# Patient Record
Sex: Female | Born: 1980 | Race: Black or African American | Hispanic: No | Marital: Married | Smoking: Never smoker
Health system: Southern US, Community
[De-identification: ages and names within clinical notes are randomized; demographics above are authoritative.]

## PROBLEM LIST (undated history)

## (undated) ENCOUNTER — Inpatient Hospital Stay (HOSPITAL_COMMUNITY): Payer: Self-pay

## (undated) DIAGNOSIS — I10 Essential (primary) hypertension: Secondary | ICD-10-CM

## (undated) DIAGNOSIS — A6000 Herpesviral infection of urogenital system, unspecified: Secondary | ICD-10-CM

## (undated) DIAGNOSIS — A630 Anogenital (venereal) warts: Secondary | ICD-10-CM

## (undated) HISTORY — DX: Anogenital (venereal) warts: A63.0

## (undated) HISTORY — DX: Herpesviral infection of urogenital system, unspecified: A60.00

---

## 2001-12-28 ENCOUNTER — Other Ambulatory Visit: Admission: RE | Admit: 2001-12-28 | Discharge: 2001-12-28 | Payer: Self-pay | Admitting: Obstetrics and Gynecology

## 2002-03-12 ENCOUNTER — Other Ambulatory Visit: Admission: RE | Admit: 2002-03-12 | Discharge: 2002-03-12 | Payer: Self-pay | Admitting: Obstetrics and Gynecology

## 2002-06-25 ENCOUNTER — Other Ambulatory Visit: Admission: RE | Admit: 2002-06-25 | Discharge: 2002-06-25 | Payer: Self-pay | Admitting: Obstetrics and Gynecology

## 2003-06-19 ENCOUNTER — Other Ambulatory Visit: Admission: RE | Admit: 2003-06-19 | Discharge: 2003-06-19 | Payer: Self-pay | Admitting: Obstetrics and Gynecology

## 2011-11-02 ENCOUNTER — Ambulatory Visit (INDEPENDENT_AMBULATORY_CARE_PROVIDER_SITE_OTHER): Payer: BC Managed Care – PPO | Admitting: Family Medicine

## 2011-11-02 ENCOUNTER — Encounter: Payer: Self-pay | Admitting: Family Medicine

## 2011-11-02 VITALS — BP 110/70 | HR 125 | Temp 98.6°F | Ht 61.0 in | Wt 146.6 lb

## 2011-11-02 DIAGNOSIS — IMO0001 Reserved for inherently not codable concepts without codable children: Secondary | ICD-10-CM

## 2011-11-02 DIAGNOSIS — Z309 Encounter for contraceptive management, unspecified: Secondary | ICD-10-CM

## 2011-11-02 DIAGNOSIS — Z0000 Encounter for general adult medical examination without abnormal findings: Secondary | ICD-10-CM

## 2011-11-02 DIAGNOSIS — Z23 Encounter for immunization: Secondary | ICD-10-CM

## 2011-11-02 LAB — BASIC METABOLIC PANEL
BUN: 12 mg/dL (ref 6–23)
CO2: 26 mEq/L (ref 19–32)
Chloride: 104 mEq/L (ref 96–112)
Potassium: 3.5 mEq/L (ref 3.5–5.1)

## 2011-11-02 LAB — POCT URINALYSIS DIPSTICK
Bilirubin, UA: NEGATIVE
Blood, UA: NEGATIVE
Glucose, UA: NEGATIVE
Ketones, UA: NEGATIVE

## 2011-11-02 LAB — HEPATIC FUNCTION PANEL
ALT: 35 U/L (ref 0–35)
Total Protein: 8.2 g/dL (ref 6.0–8.3)

## 2011-11-02 LAB — LIPID PANEL
Cholesterol: 146 mg/dL (ref 0–200)
Triglycerides: 166 mg/dL — ABNORMAL HIGH (ref 0.0–149.0)

## 2011-11-02 LAB — CBC WITH DIFFERENTIAL/PLATELET
Basophils Relative: 0.4 % (ref 0.0–3.0)
Eosinophils Relative: 1 % (ref 0.0–5.0)
HCT: 38.4 % (ref 36.0–46.0)
Lymphs Abs: 3.6 10*3/uL (ref 0.7–4.0)
MCHC: 33.1 g/dL (ref 30.0–36.0)
MCV: 91.1 fl (ref 78.0–100.0)
Monocytes Absolute: 0.8 10*3/uL (ref 0.1–1.0)
Platelets: 284 10*3/uL (ref 150.0–400.0)
WBC: 10.9 10*3/uL — ABNORMAL HIGH (ref 4.5–10.5)

## 2011-11-02 MED ORDER — LEVONORGESTREL 20 MCG/24HR IU IUD: 1.0000 | INTRAUTERINE_SYSTEM | Freq: Once | INTRAUTERINE | Status: DC

## 2011-11-02 NOTE — Patient Instructions (Signed)

## 2011-11-02 NOTE — Progress Notes (Signed)
  Subjective:    Patient ID: Brittany Ware, female    DOB: 09/08/1981, 31 y.o.   MRN: [id number]  HPI Pt here to establish.  She is utd with her cpe but has not had labs.  Pt would also like a tetanus shot.  No complaints.   Review of Systems    as above Objective:   Physical Exam  Constitutional: She is oriented to person, place, and time. She appears well-developed and well-nourished.  Neck: Normal range of motion. Neck supple.  Cardiovascular: Normal rate, regular rhythm and normal heart sounds.   No murmur heard. Pulmonary/Chest: Effort normal and breath sounds normal. No respiratory distress. She has no wheezes. She has no rales. She exhibits no tenderness.  Musculoskeletal: Normal range of motion. She exhibits no edema and no tenderness.  Neurological: She is alert and oriented to person, place, and time.  Psychiatric: She has a normal mood and affect. Her behavior is normal. Judgment and thought content normal.          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

## 2011-11-03 LAB — HIV ANTIBODY (ROUTINE TESTING W REFLEX): HIV: NONREACTIVE

## 2011-11-03 LAB — RPR

## 2012-05-08 ENCOUNTER — Telehealth: Payer: Self-pay | Admitting: Family Medicine

## 2012-05-08 NOTE — Telephone Encounter (Signed)
appt made for 7.30.13 930am

## 2012-05-08 NOTE — Telephone Encounter (Signed)
Please advise      KP 

## 2012-05-08 NOTE — Telephone Encounter (Signed)
Pt. Called needs a TB test for work if possible this week., pt was NPE  1.22.13 and this was the last & only OV CB# [phone] Let me know once ordered by physician and I will call her back to schedule

## 2012-05-08 NOTE — Telephone Encounter (Signed)
Ok to get ppd

## 2012-05-09 ENCOUNTER — Ambulatory Visit (INDEPENDENT_AMBULATORY_CARE_PROVIDER_SITE_OTHER): Payer: BC Managed Care – PPO

## 2012-05-09 DIAGNOSIS — Z111 Encounter for screening for respiratory tuberculosis: Secondary | ICD-10-CM

## 2012-05-09 MED ORDER — ACYCLOVIR 400 MG PO TABS: 400.0000 mg | ORAL_TABLET | Freq: Three times a day (TID) | ORAL | Status: DC

## 2012-05-11 ENCOUNTER — Ambulatory Visit (INDEPENDENT_AMBULATORY_CARE_PROVIDER_SITE_OTHER): Payer: BC Managed Care – PPO | Admitting: Family Medicine

## 2012-05-11 ENCOUNTER — Encounter: Payer: Self-pay | Admitting: Family Medicine

## 2012-05-11 VITALS — BP 116/70 | HR 118 | Temp 98.3°F | Ht 61.0 in | Wt 153.4 lb

## 2012-05-11 DIAGNOSIS — Z0100 Encounter for examination of eyes and vision without abnormal findings: Secondary | ICD-10-CM

## 2012-05-11 DIAGNOSIS — Z0000 Encounter for general adult medical examination without abnormal findings: Secondary | ICD-10-CM

## 2012-05-11 LAB — TB SKIN TEST: TB Skin Test: NEGATIVE

## 2012-05-11 NOTE — Progress Notes (Signed)
  Subjective:    Patient ID: Brittany Ware, female    DOB: Jan 06, 1981, 31 y.o.   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  No complaints.  She has a gyn she goes to annually.     Review of Systems    as above Objective:   Physical Exam  Constitutional: She is oriented to person, place, and time. She appears well-developed and well-nourished.  Neck: Normal range of motion. Neck supple.  Cardiovascular: Normal rate, regular rhythm and normal heart sounds.   No murmur heard. Pulmonary/Chest: Effort normal and breath sounds normal. No respiratory distress. She has no wheezes. She has no rales.  Musculoskeletal: Normal range of motion.  Neurological: She is oriented to person, place, and time.  Psychiatric: She has a normal mood and affect. Her behavior is normal. Judgment and thought content normal.          Assessment & Plan:  Well visit---- pt sees gyn, form filled out                      PPD done and read

## 2012-08-09 LAB — US OB TRANSVAGINAL

## 2012-08-17 LAB — OB RESULTS CONSOLE HEPATITIS B SURFACE ANTIGEN: Hepatitis B Surface Ag: NEGATIVE

## 2012-08-17 LAB — OB RESULTS CONSOLE ANTIBODY SCREEN: Antibody Screen: NEGATIVE

## 2012-08-17 LAB — OB RESULTS CONSOLE RUBELLA ANTIBODY, IGM: Rubella: IMMUNE

## 2012-08-17 LAB — OB RESULTS CONSOLE ABO/RH

## 2012-08-25 LAB — US OB COMP ADDL GEST LESS 14 WKS

## 2012-08-25 LAB — OB RESULTS CONSOLE GC/CHLAMYDIA
Chlamydia: NEGATIVE
Gonorrhea: NEGATIVE

## 2012-10-06 LAB — US OB DETAIL ADDL GEST + 14 WK

## 2012-12-28 ENCOUNTER — Encounter (HOSPITAL_COMMUNITY): Payer: Self-pay | Admitting: *Deleted

## 2012-12-28 ENCOUNTER — Inpatient Hospital Stay (HOSPITAL_COMMUNITY)
Admission: AD | Admit: 2012-12-28 | Discharge: 2012-12-30 | DRG: 886 | Disposition: A | Discharge status: Home / Self Care | Payer: BC Managed Care – PPO | Source: Ambulatory Visit | Attending: Obstetrics | Admitting: Obstetrics

## 2012-12-28 DIAGNOSIS — O139 Gestational [pregnancy-induced] hypertension without significant proteinuria, unspecified trimester: Secondary | ICD-10-CM | POA: Diagnosis present

## 2012-12-28 DIAGNOSIS — A6000 Herpesviral infection of urogenital system, unspecified: Secondary | ICD-10-CM | POA: Diagnosis present

## 2012-12-28 DIAGNOSIS — O99891 Other specified diseases and conditions complicating pregnancy: Secondary | ICD-10-CM | POA: Diagnosis present

## 2012-12-28 DIAGNOSIS — E0590 Thyrotoxicosis, unspecified without thyrotoxic crisis or storm: Secondary | ICD-10-CM | POA: Diagnosis present

## 2012-12-28 DIAGNOSIS — E079 Disorder of thyroid, unspecified: Secondary | ICD-10-CM | POA: Diagnosis present

## 2012-12-28 DIAGNOSIS — O365990 Maternal care for other known or suspected poor fetal growth, unspecified trimester, not applicable or unspecified: Principal | ICD-10-CM | POA: Diagnosis present

## 2012-12-28 DIAGNOSIS — O98519 Other viral diseases complicating pregnancy, unspecified trimester: Secondary | ICD-10-CM | POA: Diagnosis present

## 2012-12-28 DIAGNOSIS — Z22330 Carrier of Group B streptococcus: Secondary | ICD-10-CM

## 2012-12-28 DIAGNOSIS — IMO0002 Reserved for concepts with insufficient information to code with codable children: Secondary | ICD-10-CM | POA: Diagnosis present

## 2012-12-28 HISTORY — DX: Essential (primary) hypertension: I10

## 2012-12-28 LAB — US OB FOLLOW UP

## 2012-12-28 LAB — CBC
HCT: 34 % — ABNORMAL LOW (ref 36.0–46.0)
Hemoglobin: 11.7 g/dL — ABNORMAL LOW (ref 12.0–15.0)
MCHC: 34.4 g/dL (ref 30.0–36.0)
MCV: 87.4 fL (ref 78.0–100.0)
RDW: 13.4 % (ref 11.5–15.5)

## 2012-12-28 LAB — COMPREHENSIVE METABOLIC PANEL
ALT: 28 U/L (ref 0–35)
Albumin: 3.2 g/dL — ABNORMAL LOW (ref 3.5–5.2)
Alkaline Phosphatase: 125 U/L — ABNORMAL HIGH (ref 39–117)
Calcium: 9.9 mg/dL (ref 8.4–10.5)
Potassium: 3.9 mEq/L (ref 3.5–5.1)
Sodium: 132 mEq/L — ABNORMAL LOW (ref 135–145)
Total Protein: 7.1 g/dL (ref 6.0–8.3)

## 2012-12-28 LAB — URIC ACID: Uric Acid, Serum: 4.5 mg/dL (ref 2.4–7.0)

## 2012-12-28 MED ORDER — DOCUSATE SODIUM 100 MG PO CAPS
100.0000 mg | ORAL_CAPSULE | Freq: Every day | ORAL | Status: DC
  Administered 2012-12-29 – 2012-12-30 (×2): 100 mg via ORAL
  Filled 2012-12-28 (×2): qty 1

## 2012-12-28 MED ORDER — ZOLPIDEM TARTRATE 5 MG PO TABS
5.0000 mg | ORAL_TABLET | Freq: Every evening | ORAL | Status: DC | PRN
  Administered 2012-12-29: 5 mg via ORAL
  Filled 2012-12-28: qty 1

## 2012-12-28 MED ORDER — BETAMETHASONE SOD PHOS & ACET 6 (3-3) MG/ML IJ SUSP
12.0000 mg | INTRAMUSCULAR | Status: AC
  Administered 2012-12-28 – 2012-12-29 (×2): 12 mg via INTRAMUSCULAR
  Filled 2012-12-28 (×2): qty 2

## 2012-12-28 MED ORDER — CALCIUM CARBONATE ANTACID 500 MG PO CHEW: 2.0000 | CHEWABLE_TABLET | ORAL | Status: DC | PRN

## 2012-12-28 MED ORDER — LACTATED RINGERS IV SOLN
INTRAVENOUS | Status: DC
  Administered 2012-12-28 – 2012-12-30 (×5): via INTRAVENOUS

## 2012-12-28 MED ORDER — LABETALOL HCL 100 MG PO TABS
100.0000 mg | ORAL_TABLET | Freq: Three times a day (TID) | ORAL | Status: DC
  Administered 2012-12-29 – 2012-12-30 (×5): 100 mg via ORAL
  Filled 2012-12-28 (×9): qty 1

## 2012-12-28 MED ORDER — PRENATAL MULTIVITAMIN CH
1.0000 | ORAL_TABLET | Freq: Every day | ORAL | Status: DC
  Administered 2012-12-29 – 2012-12-30 (×2): 1 via ORAL
  Filled 2012-12-28 (×3): qty 1

## 2012-12-28 MED ORDER — ACETAMINOPHEN 325 MG PO TABS: 650.0000 mg | ORAL_TABLET | ORAL | Status: DC | PRN

## 2012-12-28 MED ORDER — ACYCLOVIR 200 MG PO CAPS
400.0000 mg | ORAL_CAPSULE | Freq: Three times a day (TID) | ORAL | Status: DC
  Administered 2012-12-28 – 2012-12-30 (×5): 400 mg via ORAL
  Filled 2012-12-28 (×8): qty 2

## 2012-12-28 MED ORDER — LACTATED RINGERS IV BOLUS (SEPSIS)
1000.0000 mL | Freq: Once | INTRAVENOUS | Status: AC
  Administered 2012-12-28: 1000 mL via INTRAVENOUS

## 2012-12-28 NOTE — H&P (Signed)
Maicey Barrientez is a 32 y.o. G1P0 at [redacted]w[redacted]d presenting for fetal monitoring for early IUGR with elevated SD ratios. Pt notes nl fetal movement  Pt notes no contractions. Good fetal movement, No vaginal bleeding, not leaking fluid.  U/s done in office today showed baby 2'7, 6%, w/ AC at 0%, AFI of 8 at 7%, vtx, ant placenta. BPP 8/8 but SD ratios done and elevated >97%.  Pt w/ gestational vs chronic htn, developed at 20 wks. Baseline PIH labs done and normal and baseline 24 hrs urine w/ 100mg  protein. Pt subsequently started on labetalol 100mg  tid. Office bp's usually in 140-150's/ 90-100's but home bp's (pt has taken up to bid) have been 130's/80s. PIH labs last done in office 12/13/12 and normal. Pt also w/ subclinical hyperthyroidism early 1st trimester, nl TSH 10/18/12. Also marginal cord insertion which was indication for u/s today.   [facility] since 10 wks - gest htn vs chronic htn as above - marginal CI vs velamentous CI at anatomy scan - GBS bacteruria - HSV which has caused recurrent lesions this pregnancy, on daily prophylaxis - subclinical hyperthyroidism in 1st trimester w/ normal free thyroid levels and nl TSH in 2nd trimester - anemia, on iron - DS 123   Prenatal Transfer Tool  Maternal Diabetes: No Genetic Screening: Normal Maternal Ultrasounds/Referrals: Abnormal:  Findings:   IUGR Fetal Ultrasounds or other Referrals:  None Maternal Substance Abuse:  No Significant Maternal Medications:  None Significant Maternal Lab Results: None     OB History   Grav Para Term Preterm Abortions TAB SAB Ect Mult Living   1              Past Medical History  Diagnosis Date  . Hypertension    History reviewed. No pertinent past surgical history. Family History: family history is not on file. Social History:  reports that she has never smoked. She does not have any smokeless tobacco history on file. She reports that she does not drink alcohol or use illicit  drugs.  Review of Systems - no abnormal symptoms other than pregnancy     Blood pressure 134/77, pulse 84, temperature 98.1 F (36.7 C), temperature source Oral, resp. rate 18, height 5' (1.524 m), weight 72.122 kg (159 lb).  Physical Exam: Gen: well appearing, no distress CV: RRR Pulm: CTAB Back: no CVAT Abd: gravid, NT, no RUQ pain LE: no edema, equal bilaterally, non-tender, 2+ DTR, no clonus Toco: none FH: baseline 150s, accelerations present, no deceleratons, 10 beat variability  Prenatal labs: ABO, Rh:  O+ Antibody:  neg Rubella:  immune RPR:   NR HBsAg:   neg HIV:   neg GBS:   positive  1 hr Glucola 123   Genetic screening nl NT, nl AFP Anatomy US nl   Assessment/Plan: 32 y.o. G1P0 at [redacted]w[redacted]d - Early IUGR w/ elevated SD ratios. Will admit for continuous fetal monitoring, repeat BPP/ UA dopplers and MFM consult in am. Given risks of indicated preterm delivery, will given BMZ now for fetal lung maturation. Consider Mag for neuroprotection if delivery anticipated w/in 24-48 hrs and less than 32 wks. Unclear etiology of IUGR. Will recheck thyroid, re-eval for velamentous cord insertion, r/o for PEC given her bp trend.  - HSV, daily acylovir prophylaxis - gest htn vs chronic htn. Will cont q8hr labetalol  - Dispo. In house until competion of BMZ and r/o for PEC w/ labs/ urine. Will re-eval need for continued admission for fetal testing at that time.  Tishawna Larouche A. 12/28/2012, 8:55 PM

## 2012-12-29 ENCOUNTER — Ambulatory Visit (HOSPITAL_COMMUNITY): Payer: BC Managed Care – PPO

## 2012-12-29 ENCOUNTER — Inpatient Hospital Stay (HOSPITAL_COMMUNITY): Payer: BC Managed Care – PPO

## 2012-12-29 LAB — PROTEIN, URINE, 24 HOUR: Protein, Urine: 4 mg/dL

## 2012-12-29 LAB — CREATININE CLEARANCE, URINE, 24 HOUR
Collection Interval-CRCL: 24 hours
Creatinine Clearance: 130 mL/min — ABNORMAL HIGH (ref 75–115)
Creatinine, 24H Ur: 1383 mg/d (ref 700–1800)
Creatinine, Urine: 65.86 mg/dL
Urine Total Volume-CRCL: 2100 mL

## 2012-12-29 NOTE — Progress Notes (Signed)
Late entry  Pt notes no HA, no vision change, no RUQ pain. Poor sleep last night, only 2 hrs despite Ambien. Good FM, no ctx, no VB, no abdominal pain.   PE:  bp's 120-130's/ 60-80's Filed Vitals:   12/29/12 1600 12/29/12 1601 12/29/12 1956 12/29/12 2100  BP:  126/61 124/67   Pulse:  92 86   Temp: 98 F (36.7 C)  97.7 F (36.5 C)   TempSrc: Tympanic  Oral   Resp: 18  18 18   Height:      Weight:       Gen: well appearing, no distress CV: RRR Pulm: CTAB Abd: no RUQ pain, gravid, NT, ND GU: def LE: no edema, NT  Toco: no ctx FH: 130s, rare 15x15 accels, good 10x10 accels, no decels, 10 beat var  CMP: wnl 24hr protein: 85mg  TSH: wnl  U/s today: BPP 8/8, AFI 7.8, growth 11%, AC <3%, SD ratios at 74%  A/P:31 yo G1 at 31'0 admitted for IUGR and elevated SD ratios in office 12/28/12  - IUGR. Unclear etiology. S/p BMZ x 2. Repeat u/s today with no IUGR, persistent low AC but other parameters (SD ratio and AFI) have all improved. Likely some component of BMZ, but also bed rest and hydration. Chronic vs gest htn but bp's have been stable. No evidence PEC as 24 hr urine has returned tonight.  - Dispo. Likely home in am for close outpt monitoring  Brittany Ware A. 12/29/2012 10:14 PM

## 2012-12-29 NOTE — Consult Note (Signed)
Maternal Fetal Medicine Consultation  Requesting Provider(s): Noland Fordyce, MD  Reason for consultation: suspected IUGR, gestational hypertension  HPI: Brittany Ware is a 32 yo G1P0 currently at 39 0/7 weeks seen for consultation due to suspected fetal growth restriction and gestational hypertension.  Brittany Ware as admitted yesterday after a clinic ultrasound showed an estimated fetal weight at the 10th %tile with elevated Umbilical Artery Dopplers. Additionally, the patient was recently diagnosed with chronic vs. Gestational hypertension.  She is currently on Labetalol 100 mg TID.  Her baseline 24-hr urine protein was 100 mg.  Admission preeclampsia labs were within normal limits and repeat 24-hr urine protein is pending.  The patient received her first betamethasone injection last night.    Brittany Ware denies a history of hypertension prior to pregnancy.  Her initial clinic BPs were 120's /80's. She reports a history of HSV and is currently on Acyclovir- her prenatal course has otherwise been uncomplicated.  The fetus is active.  She is without complaints today.  OB History: OB History   Grav Para Term Preterm Abortions TAB SAB Ect Mult Living   1               PMH:  Past Medical History  Diagnosis Date  . Hypertension     PSH: History reviewed. No pertinent past surgical history. Meds:  Scheduled Meds: . acyclovir  400 mg Oral TID  . betamethasone acetate-betamethasone sodium phosphate  12 mg Intramuscular Q24H  . docusate sodium  100 mg Oral Daily  . labetalol  100 mg Oral Q8H  . prenatal multivitamin  1 tablet Oral Q1200   Continuous Infusions: . lactated ringers 150 mL/hr at 12/29/12 0234   PRN Meds:.acetaminophen, calcium carbonate, zolpidem  Allergies: NKDA  FH: denies birth defects or hereditary disorders  Soc: denies tobacco, ETOH or illicit drug use  Review of Systems: no vaginal bleeding or cramping/contractions, no LOF, no nausea/vomiting. All other systems  reviewed and are negative.   PE:   Filed Vitals:   12/29/12 1226  BP: 138/74  Pulse: 91  Temp: 98 F (36.7 C)  Resp: 20    GEN: well-appearing female ABD: gravid, NT  Ultrasound:  Single IUP at 31 0/7 weeks Somewhat limited views of the fetal heart and face obtained due to fetal position- no anomalies noted Suspected fetal growth restriction with an estimated fetal weight at the 11th %tile.  The [facility] measures < 3rd %tile Umbilical artery Doppler studies at the 74th %tile for gestaional age.  No absent or reversed diastolic flow noted. Subjectively decreased amniotic fluid volume with an AFI of 7.8 cm. BPP 8/8  Labs: CBC    Component Value Date/Time   WBC 13.7* 12/28/2012 1840   RBC 3.89 12/28/2012 1840   HGB 11.7* 12/28/2012 1840   HCT 34.0* 12/28/2012 1840   PLT 181 12/28/2012 1840   MCV 87.4 12/28/2012 1840   MCH 30.1 12/28/2012 1840   MCHC 34.4 12/28/2012 1840   RDW 13.4 12/28/2012 1840   Uric acid - 4.5   Nl LFTs.  24-hr urine prending.  A/P: 1) Single IUP at 31 0/7 weeks         2) Gestational vs. Chronic hypertension - BPs appear to be well-controlled on current medications.  24-hr urine protein is currently pending.         3) Suspected fetal growth restriction - UA Dopplers mildly elevated but without absent or reversed diastolic flow on today's study (althoug > 97th %tile on clinic scan  yesterday).  Concur with admission and betamethasone.  If the 24-hr urine returns normal, would feel comfortable with continued close outpatient management - would recommend 2x weekly testing (NST in clinic early in the week and BPP with Doppler studies with Maternal-Fetal Medicine later in the week).  Would recommend delivery for - BPP < 6/8, absent or reversed diastolic flow on UA Dopplers.  If the 24-hr urine is abnormal (> 300 mg) would recommend continued inpatient management with daily NSTs and 2x weekly UA Doppler studies.  Would move toward delivery if preeclampsia with severe  features noted (LFTs > 2x normal, plts <100) or non reassuring fetal testing appreciated.      Thank you for the opportunity to be a part of the care of Brittany Ware. Please contact our office if we can be of further assistance.   I spent approximately 30 minutes with this patient with over 50% of time spent in face-to-face counseling.

## 2012-12-30 DIAGNOSIS — IMO0002 Reserved for concepts with insufficient information to code with codable children: Secondary | ICD-10-CM | POA: Diagnosis present

## 2012-12-30 MED ORDER — ACYCLOVIR 200 MG PO CAPS: 400.0000 mg | ORAL_CAPSULE | Freq: Three times a day (TID) | ORAL | Status: DC

## 2012-12-30 NOTE — Discharge Summary (Signed)
Patient ID: Beckey Polkowski MRN: [id number] DOB/AGE: 1981/04/02 32 y.o.  Admit date: 12/28/2012 Discharge date: 12/30/12  Admission Diagnoses: 31 WEEKS POSSIBLE PRE ECLAMPSIA, IUGR  Discharge Diagnoses: 31 low fetal growth, no IUGR, no PEC, chronic vs gestational htn        Discharged Condition: good  Hospital Course: admitted for IVF, modified bed rest and fetal monitoring. Received BMZ. Normal PEC labs including 24 hr urine, improvement in fetal u/s by growth, SD ratio and AFI.  Consults: MFM  Treatments: IV hydration  Disposition: home     Medication List    TAKE these medications       acyclovir 200 MG capsule  Commonly known as:  ZOVIRAX  Take 2 capsules (400 mg total) by mouth 3 (three) times daily.     acyclovir 400 MG tablet  Commonly known as:  ZOVIRAX  Take 400 mg by mouth 3 (three) times daily. X 10 days. Started on 12/26/12.     DHA PO  Take 1 tablet by mouth daily.     labetalol 100 MG tablet  Commonly known as:  NORMODYNE  Take 100 mg by mouth 3 (three) times daily.     prenatal multivitamin Tabs  Take 1 tablet by mouth daily at 12 noon.         Signed: Lendon Colonel., MD MD 12/30/2012, 12:41 PM

## 2012-12-30 NOTE — Progress Notes (Signed)
HD#3  Pt notes no HA, no vision change, no RUQ pain.  Good FM, no ctx, no VB, no abdominal pain. Pt would like to go home  PE:  bp's mostly 120-130's/ 60-80's, slightly elevated sbp in 140's this am  Filed Vitals:   12/30/12 0700 12/30/12 0844 12/30/12 0845 12/30/12 1212  BP:   143/74 144/76  Pulse:   77 78  Temp:  97.9 F (36.6 C)  98.1 F (36.7 C)  TempSrc:  Oral  Oral  Resp: 18 20  16   Height:      Weight:       Gen: well appearing, no distress CV: RRR Pulm: CTAB Abd: no RUQ pain, gravid, NT, ND GU: def LE: no edema, NT  Toco: no ctx FH: 145s, rare 15x15 accels, good 10x10 accels, no decels, 10 beat var  CMP: wnl 24hr protein: 85mg  TSH: wnl  U/s 3/21: BPP 8/8, AFI 7.8, growth 11%, AC <3%, SD ratios at 74%  A/P:31 yo G1 at 31'1 admitted for IUGR and elevated SD ratios in office 12/28/12  - IUGR. Unclear etiology. S/p BMZ x 2. Repeat u/s yesterday with no IUGR, persistent low AC but other parameters (SD ratio and AFI) have all improved. Likely some component of BMZ, but also bed rest and hydration. Chronic vs gest htn but bp's have been stable. No evidence PEC by  24 hr urine.  - gest vs chronic htn. Cont labetalol  - Dispo. Home this,  close outpt monitoring. Kick counts reviewed, will plan Mon/ thurs testing in office  - appreciate MFM recommendations  Brittany Ware A. 12/30/2012 12:36 PM

## 2012-12-30 NOTE — Progress Notes (Signed)
Dr Ernestina Penna called and updated on approx 2 minute decel from 150's to 120's.  RN may still discharge pt

## 2013-01-01 LAB — US OB FOLLOW UP

## 2013-01-03 ENCOUNTER — Encounter (HOSPITAL_COMMUNITY): Payer: Self-pay | Admitting: *Deleted

## 2013-01-05 LAB — US OB FOLLOW UP

## 2013-01-08 LAB — US OB FOLLOW UP

## 2013-01-11 LAB — US OB FOLLOW UP

## 2013-01-15 ENCOUNTER — Other Ambulatory Visit: Payer: Self-pay

## 2013-01-15 LAB — US OB FOLLOW UP

## 2013-01-16 ENCOUNTER — Other Ambulatory Visit (HOSPITAL_COMMUNITY): Payer: Self-pay | Admitting: Obstetrics

## 2013-01-16 DIAGNOSIS — O4103X1 Oligohydramnios, third trimester, fetus 1: Secondary | ICD-10-CM

## 2013-01-16 DIAGNOSIS — IMO0002 Reserved for concepts with insufficient information to code with codable children: Secondary | ICD-10-CM

## 2013-01-19 ENCOUNTER — Encounter (HOSPITAL_COMMUNITY): Payer: Self-pay

## 2013-01-19 ENCOUNTER — Encounter (HOSPITAL_COMMUNITY): Payer: Self-pay | Admitting: Anesthesiology

## 2013-01-19 ENCOUNTER — Ambulatory Visit (HOSPITAL_COMMUNITY)
Admission: RE | Admit: 2013-01-19 | Discharge: 2013-01-19 | Disposition: A | Discharge status: Home / Self Care | Payer: BC Managed Care – PPO | Source: Ambulatory Visit | Attending: Obstetrics | Admitting: Obstetrics

## 2013-01-19 ENCOUNTER — Inpatient Hospital Stay (HOSPITAL_COMMUNITY): Payer: BC Managed Care – PPO | Admitting: Anesthesiology

## 2013-01-19 ENCOUNTER — Other Ambulatory Visit (HOSPITAL_COMMUNITY): Payer: Self-pay | Admitting: Obstetrics

## 2013-01-19 ENCOUNTER — Ambulatory Visit (HOSPITAL_COMMUNITY): Admission: RE | Admit: 2013-01-19 | Payer: BC Managed Care – PPO | Source: Ambulatory Visit

## 2013-01-19 ENCOUNTER — Encounter (HOSPITAL_COMMUNITY)
Admission: AD | Disposition: A | Discharge status: Home / Self Care | Payer: Self-pay | Source: Ambulatory Visit | Attending: Obstetrics

## 2013-01-19 ENCOUNTER — Inpatient Hospital Stay (HOSPITAL_COMMUNITY)
Admission: AD | Admit: 2013-01-19 | Discharge: 2013-01-21 | DRG: 370 | Disposition: A | Discharge status: Home / Self Care | Payer: BC Managed Care – PPO | Source: Ambulatory Visit | Attending: Obstetrics | Admitting: Obstetrics

## 2013-01-19 VITALS — BP 136/92 | HR 95 | Wt 162.0 lb

## 2013-01-19 DIAGNOSIS — O10019 Pre-existing essential hypertension complicating pregnancy, unspecified trimester: Secondary | ICD-10-CM | POA: Insufficient documentation

## 2013-01-19 DIAGNOSIS — E0590 Thyrotoxicosis, unspecified without thyrotoxic crisis or storm: Secondary | ICD-10-CM | POA: Diagnosis present

## 2013-01-19 DIAGNOSIS — Z22330 Carrier of Group B streptococcus: Secondary | ICD-10-CM

## 2013-01-19 DIAGNOSIS — O98519 Other viral diseases complicating pregnancy, unspecified trimester: Secondary | ICD-10-CM | POA: Diagnosis present

## 2013-01-19 DIAGNOSIS — O4103X1 Oligohydramnios, third trimester, fetus 1: Secondary | ICD-10-CM

## 2013-01-19 DIAGNOSIS — IMO0002 Reserved for concepts with insufficient information to code with codable children: Secondary | ICD-10-CM

## 2013-01-19 DIAGNOSIS — E079 Disorder of thyroid, unspecified: Secondary | ICD-10-CM | POA: Diagnosis present

## 2013-01-19 DIAGNOSIS — A6000 Herpesviral infection of urogenital system, unspecified: Secondary | ICD-10-CM | POA: Diagnosis present

## 2013-01-19 DIAGNOSIS — O1002 Pre-existing essential hypertension complicating childbirth: Secondary | ICD-10-CM | POA: Diagnosis present

## 2013-01-19 DIAGNOSIS — O9903 Anemia complicating the puerperium: Secondary | ICD-10-CM | POA: Diagnosis not present

## 2013-01-19 DIAGNOSIS — O4100X0 Oligohydramnios, unspecified trimester, not applicable or unspecified: Secondary | ICD-10-CM | POA: Insufficient documentation

## 2013-01-19 DIAGNOSIS — O365990 Maternal care for other known or suspected poor fetal growth, unspecified trimester, not applicable or unspecified: Secondary | ICD-10-CM | POA: Insufficient documentation

## 2013-01-19 DIAGNOSIS — D649 Anemia, unspecified: Secondary | ICD-10-CM | POA: Diagnosis not present

## 2013-01-19 DIAGNOSIS — O99284 Endocrine, nutritional and metabolic diseases complicating childbirth: Secondary | ICD-10-CM | POA: Diagnosis present

## 2013-01-19 DIAGNOSIS — O99892 Other specified diseases and conditions complicating childbirth: Secondary | ICD-10-CM | POA: Diagnosis present

## 2013-01-19 LAB — COMPREHENSIVE METABOLIC PANEL
ALT: 26 U/L (ref 0–35)
Alkaline Phosphatase: 158 U/L — ABNORMAL HIGH (ref 39–117)
CO2: 22 mEq/L (ref 19–32)
Chloride: 102 mEq/L (ref 96–112)
GFR calc Af Amer: >90 mL/min (ref >90)
GFR calc non Af Amer: >90 mL/min (ref >90)
Glucose, Bld: 81 mg/dL (ref 70–99)
Potassium: 4.2 mEq/L (ref 3.5–5.1)
Sodium: 135 mEq/L (ref 135–145)

## 2013-01-19 LAB — CBC
HCT: 35.7 % — ABNORMAL LOW (ref 36.0–46.0)
Hemoglobin: 12 g/dL (ref 12.0–15.0)
MCH: 29.5 pg (ref 26.0–34.0)
MCHC: 33.6 g/dL (ref 30.0–36.0)
MCV: 87.7 fL (ref 78.0–100.0)

## 2013-01-19 SURGERY — Surgical Case
Anesthesia: Regional | Wound class: Clean Contaminated

## 2013-01-19 MED ORDER — SCOPOLAMINE 1 MG/3DAYS TD PT72
MEDICATED_PATCH | TRANSDERMAL | Status: AC
  Filled 2013-01-19: qty 1

## 2013-01-19 MED ORDER — KETOROLAC TROMETHAMINE 30 MG/ML IJ SOLN: 30.0000 mg | Freq: Four times a day (QID) | INTRAMUSCULAR | Status: DC | PRN

## 2013-01-19 MED ORDER — SENNOSIDES-DOCUSATE SODIUM 8.6-50 MG PO TABS
2.0000 | ORAL_TABLET | Freq: Every day | ORAL | Status: DC
  Administered 2013-01-19 – 2013-01-20 (×2): 2 via ORAL

## 2013-01-19 MED ORDER — PRENATAL MULTIVITAMIN CH
1.0000 | ORAL_TABLET | Freq: Every day | ORAL | Status: DC
  Administered 2013-01-20: 1 via ORAL
  Filled 2013-01-19: qty 1

## 2013-01-19 MED ORDER — DIPHENHYDRAMINE HCL 25 MG PO CAPS: 25.0000 mg | ORAL_CAPSULE | ORAL | Status: DC | PRN

## 2013-01-19 MED ORDER — OXYTOCIN 40 UNITS IN LACTATED RINGERS INFUSION - SIMPLE MED: 62.5000 mL/h | INTRAVENOUS | Status: AC

## 2013-01-19 MED ORDER — DIPHENHYDRAMINE HCL 50 MG/ML IJ SOLN: 25.0000 mg | INTRAMUSCULAR | Status: DC | PRN

## 2013-01-19 MED ORDER — OXYTOCIN 40 UNITS IN LACTATED RINGERS INFUSION - SIMPLE MED
1.0000 m[IU]/min | INTRAVENOUS | Status: DC
  Administered 2013-01-19: 2 m[IU]/min via INTRAVENOUS
  Filled 2013-01-19: qty 1000

## 2013-01-19 MED ORDER — NALBUPHINE HCL 10 MG/ML IJ SOLN
5.0000 mg | INTRAMUSCULAR | Status: DC | PRN
  Filled 2013-01-19: qty 1

## 2013-01-19 MED ORDER — MENTHOL 3 MG MT LOZG: 1.0000 | LOZENGE | OROMUCOSAL | Status: DC | PRN

## 2013-01-19 MED ORDER — ONDANSETRON HCL 4 MG PO TABS: 4.0000 mg | ORAL_TABLET | ORAL | Status: DC | PRN

## 2013-01-19 MED ORDER — OXYTOCIN BOLUS FROM INFUSION: 500.0000 mL | INTRAVENOUS | Status: DC

## 2013-01-19 MED ORDER — KETOROLAC TROMETHAMINE 30 MG/ML IJ SOLN
INTRAMUSCULAR | Status: AC
  Filled 2013-01-19: qty 1

## 2013-01-19 MED ORDER — METOCLOPRAMIDE HCL 5 MG/ML IJ SOLN: 10.0000 mg | Freq: Three times a day (TID) | INTRAMUSCULAR | Status: DC | PRN

## 2013-01-19 MED ORDER — MORPHINE SULFATE (PF) 0.5 MG/ML IJ SOLN
INTRAMUSCULAR | Status: DC | PRN
  Administered 2013-01-19: .15 mg via INTRATHECAL

## 2013-01-19 MED ORDER — NALOXONE HCL 1 MG/ML IJ SOLN
1.0000 ug/kg/h | INTRAVENOUS | Status: DC | PRN
  Filled 2013-01-19: qty 2

## 2013-01-19 MED ORDER — FENTANYL CITRATE 0.05 MG/ML IJ SOLN
INTRAMUSCULAR | Status: DC | PRN
  Administered 2013-01-19: 25 ug via INTRATHECAL

## 2013-01-19 MED ORDER — ONDANSETRON HCL 4 MG/2ML IJ SOLN: 4.0000 mg | INTRAMUSCULAR | Status: DC | PRN

## 2013-01-19 MED ORDER — DEXTROSE 5 % IV SOLN
5.0000 10*6.[IU] | Freq: Once | INTRAVENOUS | Status: AC
  Administered 2013-01-19: 5 10*6.[IU] via INTRAVENOUS
  Filled 2013-01-19: qty 5

## 2013-01-19 MED ORDER — IBUPROFEN 600 MG PO TABS: 600.0000 mg | ORAL_TABLET | Freq: Four times a day (QID) | ORAL | Status: DC | PRN

## 2013-01-19 MED ORDER — LACTATED RINGERS IV SOLN: 500.0000 mL | INTRAVENOUS | Status: DC | PRN

## 2013-01-19 MED ORDER — LIDOCAINE HCL (PF) 1 % IJ SOLN: 30.0000 mL | INTRAMUSCULAR | Status: DC | PRN

## 2013-01-19 MED ORDER — ZOLPIDEM TARTRATE 5 MG PO TABS: 5.0000 mg | ORAL_TABLET | Freq: Every evening | ORAL | Status: DC | PRN

## 2013-01-19 MED ORDER — LACTATED RINGERS IV SOLN
INTRAVENOUS | Status: DC | PRN
  Administered 2013-01-19: 12:00:00 via INTRAVENOUS

## 2013-01-19 MED ORDER — LACTATED RINGERS IV SOLN: INTRAVENOUS | Status: DC

## 2013-01-19 MED ORDER — ONDANSETRON HCL 4 MG/2ML IJ SOLN: 4.0000 mg | Freq: Four times a day (QID) | INTRAMUSCULAR | Status: DC | PRN

## 2013-01-19 MED ORDER — ACETAMINOPHEN 325 MG PO TABS: 650.0000 mg | ORAL_TABLET | ORAL | Status: DC | PRN

## 2013-01-19 MED ORDER — SIMETHICONE 80 MG PO CHEW
80.0000 mg | CHEWABLE_TABLET | Freq: Three times a day (TID) | ORAL | Status: DC
  Administered 2013-01-19 – 2013-01-21 (×6): 80 mg via ORAL

## 2013-01-19 MED ORDER — CITRIC ACID-SODIUM CITRATE 334-500 MG/5ML PO SOLN
30.0000 mL | ORAL | Status: DC | PRN
  Administered 2013-01-19: 30 mL via ORAL
  Filled 2013-01-19: qty 15

## 2013-01-19 MED ORDER — ACYCLOVIR 200 MG PO CAPS
400.0000 mg | ORAL_CAPSULE | Freq: Three times a day (TID) | ORAL | Status: DC
  Administered 2013-01-19 – 2013-01-21 (×5): 400 mg via ORAL
  Filled 2013-01-19 (×8): qty 2

## 2013-01-19 MED ORDER — LACTATED RINGERS IV SOLN
INTRAVENOUS | Status: DC
  Administered 2013-01-19 (×3): via INTRAVENOUS

## 2013-01-19 MED ORDER — DIBUCAINE 1 % RE OINT: 1.0000 "application " | TOPICAL_OINTMENT | RECTAL | Status: DC | PRN

## 2013-01-19 MED ORDER — OXYTOCIN 40 UNITS IN LACTATED RINGERS INFUSION - SIMPLE MED: 62.5000 mL/h | INTRAVENOUS | Status: DC

## 2013-01-19 MED ORDER — FENTANYL CITRATE 0.05 MG/ML IJ SOLN
INTRAMUSCULAR | Status: AC
  Filled 2013-01-19: qty 2

## 2013-01-19 MED ORDER — SCOPOLAMINE 1 MG/3DAYS TD PT72
1.0000 | MEDICATED_PATCH | Freq: Once | TRANSDERMAL | Status: DC
  Administered 2013-01-19: 1.5 mg via TRANSDERMAL

## 2013-01-19 MED ORDER — MORPHINE SULFATE 0.5 MG/ML IJ SOLN
INTRAMUSCULAR | Status: AC
  Filled 2013-01-19: qty 10

## 2013-01-19 MED ORDER — DIPHENHYDRAMINE HCL 25 MG PO CAPS: 25.0000 mg | ORAL_CAPSULE | Freq: Four times a day (QID) | ORAL | Status: DC | PRN

## 2013-01-19 MED ORDER — OXYCODONE-ACETAMINOPHEN 5-325 MG PO TABS
1.0000 | ORAL_TABLET | ORAL | Status: DC | PRN
  Administered 2013-01-20: 1 via ORAL
  Filled 2013-01-19 (×2): qty 1

## 2013-01-19 MED ORDER — LANOLIN HYDROUS EX OINT: 1.0000 "application " | TOPICAL_OINTMENT | CUTANEOUS | Status: DC | PRN

## 2013-01-19 MED ORDER — KETOROLAC TROMETHAMINE 30 MG/ML IJ SOLN
30.0000 mg | Freq: Four times a day (QID) | INTRAMUSCULAR | Status: DC | PRN
  Administered 2013-01-19: 30 mg via INTRAMUSCULAR

## 2013-01-19 MED ORDER — SIMETHICONE 80 MG PO CHEW: 80.0000 mg | CHEWABLE_TABLET | ORAL | Status: DC | PRN

## 2013-01-19 MED ORDER — IBUPROFEN 600 MG PO TABS
600.0000 mg | ORAL_TABLET | Freq: Four times a day (QID) | ORAL | Status: DC
  Administered 2013-01-20 – 2013-01-21 (×6): 600 mg via ORAL
  Filled 2013-01-19 (×6): qty 1

## 2013-01-19 MED ORDER — DIPHENHYDRAMINE HCL 50 MG/ML IJ SOLN
12.5000 mg | INTRAMUSCULAR | Status: DC | PRN
Start: 2013-01-19 — End: 2013-01-21

## 2013-01-19 MED ORDER — TERBUTALINE SULFATE 1 MG/ML IJ SOLN: 0.2500 mg | Freq: Once | INTRAMUSCULAR | Status: DC | PRN

## 2013-01-19 MED ORDER — ONDANSETRON HCL 4 MG/2ML IJ SOLN
INTRAMUSCULAR | Status: AC
  Filled 2013-01-19: qty 2

## 2013-01-19 MED ORDER — FENTANYL CITRATE 0.05 MG/ML IJ SOLN: 25.0000 ug | INTRAMUSCULAR | Status: DC | PRN

## 2013-01-19 MED ORDER — LABETALOL HCL 100 MG PO TABS
100.0000 mg | ORAL_TABLET | Freq: Three times a day (TID) | ORAL | Status: DC
  Administered 2013-01-19 – 2013-01-21 (×4): 100 mg via ORAL
  Filled 2013-01-19 (×8): qty 1

## 2013-01-19 MED ORDER — ONDANSETRON HCL 4 MG/2ML IJ SOLN
INTRAMUSCULAR | Status: DC | PRN
  Administered 2013-01-19: 4 mg via INTRAVENOUS

## 2013-01-19 MED ORDER — MEPERIDINE HCL 25 MG/ML IJ SOLN: 6.2500 mg | INTRAMUSCULAR | Status: DC | PRN

## 2013-01-19 MED ORDER — TETANUS-DIPHTH-ACELL PERTUSSIS 5-2.5-18.5 LF-MCG/0.5 IM SUSP: 0.5000 mL | Freq: Once | INTRAMUSCULAR | Status: DC

## 2013-01-19 MED ORDER — NALOXONE HCL 0.4 MG/ML IJ SOLN: 0.4000 mg | INTRAMUSCULAR | Status: DC | PRN

## 2013-01-19 MED ORDER — OXYTOCIN 10 UNIT/ML IJ SOLN
INTRAMUSCULAR | Status: DC | PRN
  Administered 2013-01-19: 40 [IU] via INTRAMUSCULAR

## 2013-01-19 MED ORDER — ONDANSETRON HCL 4 MG/2ML IJ SOLN: 4.0000 mg | Freq: Three times a day (TID) | INTRAMUSCULAR | Status: DC | PRN

## 2013-01-19 MED ORDER — PENICILLIN G POTASSIUM 5000000 UNITS IJ SOLR
2.5000 10*6.[IU] | INTRAVENOUS | Status: DC
  Filled 2013-01-19 (×4): qty 2.5

## 2013-01-19 MED ORDER — ACYCLOVIR 400 MG PO TABS
400.0000 mg | ORAL_TABLET | Freq: Three times a day (TID) | ORAL | Status: DC
  Filled 2013-01-19 (×3): qty 1

## 2013-01-19 MED ORDER — OXYTOCIN 10 UNIT/ML IJ SOLN
INTRAMUSCULAR | Status: AC
  Filled 2013-01-19: qty 4

## 2013-01-19 MED ORDER — BUPIVACAINE IN DEXTROSE 0.75-8.25 % IT SOLN
INTRATHECAL | Status: DC | PRN
  Administered 2013-01-19: 1.4 mL via INTRATHECAL

## 2013-01-19 MED ORDER — WITCH HAZEL-GLYCERIN EX PADS: 1.0000 "application " | MEDICATED_PAD | CUTANEOUS | Status: DC | PRN

## 2013-01-19 MED ORDER — SODIUM CHLORIDE 0.9 % IJ SOLN: 3.0000 mL | INTRAMUSCULAR | Status: DC | PRN

## 2013-01-19 MED ORDER — OXYCODONE-ACETAMINOPHEN 5-325 MG PO TABS: 1.0000 | ORAL_TABLET | ORAL | Status: DC | PRN

## 2013-01-19 SURGICAL SUPPLY — 34 items
CLOTH BEACON ORANGE TIMEOUT ST (SAFETY) ×2 IMPLANT
CONTAINER PREFILL 10% NBF 15ML (MISCELLANEOUS) IMPLANT
DRAPE LG THREE QUARTER DISP (DRAPES) ×2 IMPLANT
DRSG OPSITE POSTOP 4X10 (GAUZE/BANDAGES/DRESSINGS) ×2 IMPLANT
DURAPREP 26ML APPLICATOR (WOUND CARE) ×2 IMPLANT
ELECT REM PT RETURN 9FT ADLT (ELECTROSURGICAL) ×2
ELECTRODE REM PT RTRN 9FT ADLT (ELECTROSURGICAL) ×1 IMPLANT
EXTRACTOR VACUUM KIWI (MISCELLANEOUS) IMPLANT
EXTRACTOR VACUUM M CUP 4 TUBE (SUCTIONS) IMPLANT
GLOVE BIO SURGEON STRL SZ 6 (GLOVE) ×2 IMPLANT
GLOVE BIO SURGEON STRL SZ 6.5 (GLOVE) ×8 IMPLANT
GLOVE BIOGEL PI IND STRL 7.0 (GLOVE) ×1 IMPLANT
GLOVE BIOGEL PI INDICATOR 7.0 (GLOVE) ×1
GLOVE SURG SS PI 7.5 STRL IVOR (GLOVE) ×2 IMPLANT
GOWN STRL REIN XL XLG (GOWN DISPOSABLE) ×4 IMPLANT
KIT ABG SYR 3ML LUER SLIP (SYRINGE) IMPLANT
NEEDLE HYPO 25X5/8 SAFETYGLIDE (NEEDLE) IMPLANT
NS IRRIG 1000ML POUR BTL (IV SOLUTION) ×2 IMPLANT
PACK C SECTION WH (CUSTOM PROCEDURE TRAY) ×2 IMPLANT
PAD OB MATERNITY 4.3X12.25 (PERSONAL CARE ITEMS) ×2 IMPLANT
SLEEVE SCD COMPRESS KNEE MED (MISCELLANEOUS) IMPLANT
STAPLER VISISTAT 35W (STAPLE) IMPLANT
STRIP CLOSURE SKIN 1/2X4 (GAUZE/BANDAGES/DRESSINGS) IMPLANT
SUT MON AB 4-0 PS1 27 (SUTURE) IMPLANT
SUT PLAIN 0 NONE (SUTURE) IMPLANT
SUT PLAIN 2 0 XLH (SUTURE) IMPLANT
SUT VIC AB 0 CT1 36 (SUTURE) ×2 IMPLANT
SUT VIC AB 0 CTX 36 (SUTURE) ×3
SUT VIC AB 0 CTX36XBRD ANBCTRL (SUTURE) ×3 IMPLANT
SUT VIC AB 2-0 CT1 27 (SUTURE) ×1
SUT VIC AB 2-0 CT1 TAPERPNT 27 (SUTURE) ×1 IMPLANT
TOWEL OR 17X24 6PK STRL BLUE (TOWEL DISPOSABLE) ×6 IMPLANT
TRAY FOLEY CATH 14FR (SET/KITS/TRAYS/PACK) IMPLANT
WATER STERILE IRR 1000ML POUR (IV SOLUTION) ×2 IMPLANT

## 2013-01-19 NOTE — Progress Notes (Signed)
Brittany Ware  was seen today for an ultrasound appointment.  See full report in [facility].  Comments: Brittany Ware returns for follow  up due to suspected fetal growth restriction.  She was admitted in March for a 24-hr urine protein collection and betamethasone.  Her most recent 24-hr urine was within normal limits.  Her blood pressure on arrival to clinic was 156/98; repeat 136/92.   Ultrasound today reveals oligohydramnios.  Interval growth has been marginal (80 g over 3 weeks) with an estimated fetal weight well below the 10th %tile. Umbilical artery Doppler studies are elevated (> 90th %tile) with at least on tracing showing intermittent absent diastolic flow.  The MCA PI  was < 2.5% consistent with cephalization.  BPP was 6/8 (-2 for absent breathing movement).  Impression: Single IUP at 34 0/7 weeks Marginal fetal growth over the last 3 weeks. EFW well below the 10th %tile. Oligohydramnios Elevated UA Dopplers with some intermittently absent diastolic flow BPP 6/8 (-2 for absent fetal breathing movement)  Recommendations: Based on new findings of oligohydramnios, intermittently absent diastolic flow on UA Dopplers and poor interval growth- recommend delivery. Had brief discussion with patient regarding route of delivery - likelihood that the fetus will not tolerate labor. Consider CST - if positive, would move toward cesarean delivery. Consider preeclampsia labs - would have low threshold for magnesium sulfate prophylaxis if patient continues to be hypertensive.  Findings and recommendations discussed with Dr. Algie Coffer.  Alpha Gula, MD

## 2013-01-19 NOTE — Anesthesia Postprocedure Evaluation (Signed)
Anesthesia Post Note  Patient: Brittany Ware  Procedure(s) Performed: Procedure(s) (LRB): CESAREAN SECTION (N/A)  Anesthesia type: Spinal  Patient location: Mother/Baby  Post pain: Pain level controlled  Post assessment: Post-op Vital signs reviewed  Last Vitals:  Filed Vitals:   01/19/13 1700  BP: 142/92  Pulse: 79  Temp: 36.6 C  Resp: 18    Post vital signs: Reviewed  Level of consciousness: awake  Complications: No apparent anesthesia complications

## 2013-01-19 NOTE — Transfer of Care (Signed)
Immediate Anesthesia Transfer of Care Note  Patient: Brittany Ware  Procedure(s) Performed: Procedure(s) with comments: CESAREAN SECTION (N/A) - primary   Patient Location: PACU  Anesthesia Type:Spinal  Level of Consciousness: awake  Airway & Oxygen Therapy: Patient Spontanous Breathing  Post-op Assessment: Report given to PACU RN  Post vital signs: Reviewed and stable  Complications: No apparent anesthesia complications

## 2013-01-19 NOTE — ED Notes (Signed)
Pt taken to room 171 for CST.

## 2013-01-19 NOTE — Anesthesia Preprocedure Evaluation (Addendum)
Anesthesia Evaluation  Patient identified by MRN, date of birth, ID band Patient awake    Reviewed: Allergy & Precautions, H&P , Patient's Chart, lab work & pertinent test results  Airway Mallampati: IV TM Distance: >3 FB Neck ROM: limited   Comment: Large tongue Dental no notable dental hx.    Pulmonary neg pulmonary ROS,  breath sounds clear to auscultation  Pulmonary exam normal       Cardiovascular hypertension, negative cardio ROS  Rhythm:regular Rate:Normal     Neuro/Psych negative neurological ROS  negative psych ROS   GI/Hepatic negative GI ROS, Neg liver ROS,   Endo/Other  negative endocrine ROS  Renal/GU negative Renal ROS     Musculoskeletal   Abdominal   Peds  Hematology negative hematology ROS (+)   Anesthesia Other Findings   Reproductive/Obstetrics (+) Pregnancy                          Anesthesia Physical Anesthesia Plan  ASA: III and emergent  Anesthesia Plan: Spinal   Post-op Pain Management:    Induction:   Airway Management Planned: Natural Airway  Additional Equipment:   Intra-op Plan:   Post-operative Plan:   Informed Consent: I have reviewed the patients History and Physical, chart, labs and discussed the procedure including the risks, benefits and alternatives for the proposed anesthesia with the patient or authorized representative who has indicated his/her understanding and acceptance.   Dental advisory given  Plan Discussed with: CRNA, Anesthesiologist and Surgeon  Anesthesia Plan Comments:        Anesthesia Quick Evaluation

## 2013-01-19 NOTE — Op Note (Addendum)
01/19/2013  1:21 PM  PATIENT:  Brittany Ware  32 y.o. female  PRE-OPERATIVE DIAGNOSIS:  non reasurring fetal heart rate, IUGR, intermittent absent end diastolic flow  POST-OPERATIVE DIAGNOSIS:  non reasurring fetal heart rate, IUGR, intermittent absent end diastolic flow  PROCEDURE:  Procedure(s) with comments: CESAREAN SECTION (N/A) - primary  Low transverse cesarean section with 2 layer closure SURGEON:  Surgeon(s) and Role:    * Darcel Frane A. Ernestina Penna, MD - Primary  PHYSICIAN ASSISTANT:   ASSISTANTSBilly Coast, MD   ANESTHESIA:   spinal  EBL:  Total I/O In: 1900 [I.V.:1900] Out: 800 [Urine:200; Blood:600]  BLOOD ADMINISTERED:none  DRAINS: Urinary Catheter (Foley)   LOCAL MEDICATIONS USED:  NONE  SPECIMEN:  Source of Specimen:  Placenta, cord blood  DISPOSITION OF SPECIMEN:  PATHOLOGY  COUNTS:  YES  TOURNIQUET:  * No tourniquets in log *  DICTATION: .Dragon Dictation  PLAN OF CARE: Admit to inpatient   PATIENT DISPOSITION:  PACU - hemodynamically stable.   Delay start of Pharmacological VTE agent (>24hrs) due to surgical blood loss or risk of bleeding: yes   Findings:  @BABYSEXEBC @ infant,  APGAR (1 MIN): 9   APGAR (5 MINS): 9   APGAR (10 MINS):     EBL: per anasthersia cc Antibiotics:  2 g Ancef  Complications: none  Indications: This is a 32 y.o. year-old, G1 P0  At [redacted]w[redacted]d admitted for delivery due to IUGR with oligohydramnios and intermittent absent end-diastolic flow. Discussed with patient options of primary cesarean section versus attempted contraction stress test. Over approximately one and a half hours on the fetal monitor, baby showed minimal variability and no accelerations. Pitocin was started the patient only received 3 contractions with each contraction baby had a late deceleration. Given that patient was remote from delivery and the nonreassuring fetal testing as well as the known placental insufficiency the decision was made to proceed with cesarean  section. Risks benefits and alternatives of the procedure were discussed with the patient who agreed to proceed  Procedure:  After informed consent was obtained the patient was taken to the operating room where spinal anesthesia was initiated.  She was prepped and draped in the normal sterile fashion in dorsal supine position with a leftward tilt.  A foley catheter was inserted sterilely into the bladder. Gloves were changes and attention was turned to the patient's abdomen. A Pfannenstiel skin incision was made 2 cm above the pubic symphysis in the midline with the scalpel.  Dissection was carried down with the Bovie cautery until the fascia was reached. The fascia was incised in the midline. The incision was extended laterally with the Mayo scissors. The inferior aspect of the fascial incision was grasped with the Coker clamps, elevated up and the underlying rectus muscles were dissected off sharply. The superior aspect of the fascial incision was grasped with the Coker clamps elevated up and the underlying rectus muscles were dissected off sharply.  The peritoneum was entered sharply. The peritoneal incision was extended superiorly and inferiorly with good visualization of the bladder. The bladder blade was inserted, the vesicouterine peritoneum was identified but no bladder flap was made. Palpation was done to assess the fetal position and the location of the uterine vessels. Increased vascularity was noted in the lower uterine segment. Overall the lower uterine segment was underdeveloped the there was still sufficient room for a low transverse cesarean section. The lower segment of the uterus was incised sharply with the scalpel and extended superiorly and laterally with the bandage  scissors. The infant also was grasped brought to the incision,  rotated and the infant was delivered with fundal pressure. The nose and mouth were bulb suctioned. The cord was clamped and cut. The infant was handed off to the  waiting pediatrician. The placenta was expressed. The uterus was exteriorized. The uterus was cleared of all clots and debris. The uterine incision was repaired with 0 Vicryl in a running locked fashion.  A second layer of the same suture was used in an imbricating fashion to obtain excellent hemostasis. 4 additional figure-of-eight sutures were placed along the left angle of the incision for hemostasis.  The uterus was then returned to the abdomen, the gutters were cleared of all clots and debris. The uterine incision was reinspected and found to be hemostatic. The peritoneum was grasped and closed with 2-0 Vicryl in a running fashion. The cut muscle edges and the underside of the fascia were inspected and found to be hemostatic. The fascia was closed with 0 Vicryl in a running suture. The subcutaneous tissue was irrigated. Scarpa's layer was closed with a 2-0 plain gut suture. The skin was closed staples.  The patient tolerated the procedure well. Sponge lap and needle counts were correct x3 and patient was taken to the recovery room in a stable condition.  Gabriel Paulding A. 01/19/2013 1:22 PM   Operative findings: Female in the vertex position, small uterus with normal contour, normal tubes and ovaries, normal placenta, three-vessel cord, clear amniotic fluid, no clear etiology for growth restriction.  Thorne Wirz A. 01/19/2013 1:27 PM

## 2013-01-19 NOTE — Progress Notes (Signed)
Discussed plan of care with pt for primary c-section, discussed risks and benefits, pt verbalized understanding

## 2013-01-19 NOTE — H&P (Signed)
CC: admitted for delivery given IUGR with oligohydramnios and intermittent absent EDF.   HPI: Brittany Ware is a 32 y.o. G1P0 at [redacted]w[redacted]d presenting for delivery. Pt has been followed over the past 3 wks for low fetal growth with elevated SD ratios. Pt was admitted for  fetal monitoring and BMZ at 31 wks when office u/s showed  IUGR with elevated SD ratios. After BMZ, SD ratios improved for about 1 wk and f/u u/s showed baby increased in wt to the 10%. Since that time, SD ratios increased to 97% and pt has now developed oligohydramnios and now intermittent absent SD ratios. BPP this am 6/8.  MFM consult today recommended delivery at this time.  Pt notes overall good fetal movement though decreased some today. No HA, no vision change, no RUQ pain, no N/V. No LOF, no ctx.   Pt w/ gestational vs chronic htn, developed at 20 wks. Baseline PIH labs done and normal and baseline 24 hrs urine w/ 100mg  protein. Pt subsequently started on labetalol 100mg  tid. Office bp's usually in 140-150's/ 90-100's but home bp's (pt has taken up to bid) have been 130's/80s. PIH labs done weekly over the past month and always normal, 24 hr urine for protein also normal. Pt also w/ subclinical hyperthyroidism early 1st trimester, nl TSH 10/18/12. Also marginal cord insertion which was indication for u/s today.   [facility]  - IUGR,  U/s at 31 wks: 2'7, 6%, w/ AC at 0%, AFI of 8 at 7%, vtx, ant placenta. BPP 8/8 but SD ratios  elevated >97%.   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, on daily prophylaxis since mid 2nd trimester and no current lesions - subclinical hyperthyroidism in 1st trimester w/ normal free thyroid levels and nl TSH in 2nd trimester  - anemia, on iron  - DS 123   Prenatal Transfer Tool   Maternal Diabetes: No  Genetic  Screening: Normal  Maternal Ultrasounds/Referrals: Abnormal: Findings: IUGR  Fetal Ultrasounds or other Referrals: None  Maternal Substance Abuse: No  Significant Maternal Medications: None  Significant Maternal Lab Results: None  OB History    Grav  Para  Term  Preterm  Abortions  TAB  SAB  Ect  Mult  Living    1               Past Medical History   Diagnosis  Date   .  Hypertension     History reviewed. No pertinent past surgical history.  Family History: family history is not on file.  Social History: reports that she has never smoked. She does not have any smokeless tobacco history on file. She reports that she does not drink alcohol or use illicit drugs.  Review of Systems - no abnormal symptoms other than pregnancy   .  Physical Exam:    Gen: well appearing, no distress, anxious CV: RRR  Pulm: CTAB  Back: no CVAT  Abd: gravid, NT, no RUQ pain  LE: no edema, equal bilaterally, non-tender, 2+ DTR, no clonus  Toco: none  FH: baseline 150s, accelerations present, no deceleratons, 10 beat variability  GU: cvx FT/ long/ vtx -3/ medium consistency/ posterior  Prenatal labs:  ABO, Rh: O+  Antibody: neg  Rubella: immune  RPR: NR  HBsAg: neg  HIV: neg  GBS: positive  1 hr Glucola 123  Genetic screening nl NT, nl AFP  Anatomy US nl   Assessment/Plan: 33 y.o. G1P0 at [redacted]w[redacted]d - IUGR w/ oligohydramnios and intermittent absent SD ratios. Move towards delivery. I d/w pt option for PCS now, as high liklihood baby will not tolerate labor, vs attempt at CST and move to delivery PCS if baby does not tolerated contractions. If baby passes CST, will give consideration of ripening with cervical foley and cytotec. - HSV, no current lesions, OK to attempt vaginal delivery - gest htn vs chronic htn. Will cont q8hr labetalol, repeat PIH labs now.  - GBS pos. Start PCN now.

## 2013-01-19 NOTE — Anesthesia Postprocedure Evaluation (Signed)
  Anesthesia Post-op Note  Patient: Brittany Ware  Procedure(s) Performed: Procedure(s) with comments: CESAREAN SECTION (N/A) - primary   Patient Location: PACU  Anesthesia Type:Spinal  Level of Consciousness: awake, alert  and oriented  Airway and Oxygen Therapy: Patient Spontanous Breathing  Post-op Pain: 2 /10, mild  Post-op Assessment: Post-op Vital signs reviewed, Patient's Cardiovascular Status Stable, Respiratory Function Stable, Patent Airway, No signs of Nausea or vomiting, Pain level controlled, No headache, No backache and No residual numbness  Post-op Vital Signs: Reviewed and stable  Complications: No apparent anesthesia complications

## 2013-01-19 NOTE — Brief Op Note (Signed)
01/19/2013  1:21 PM  PATIENT:  Brittany Ware  32 y.o. female  PRE-OPERATIVE DIAGNOSIS:  non reasurring fetal heart rate, IUGR, intermittent absent end diastolic flow  POST-OPERATIVE DIAGNOSIS:  non reasurring fetal heart rate, IUGR, intermittent absent end diastolic flow  PROCEDURE:  Procedure(s) with comments: CESAREAN SECTION (N/A) - primary  Low transverse cesarean section with 2 layer closure SURGEON:  Surgeon(s) and Role:    * Reid Nawrot A. Ernestina Penna, MD - Primary  PHYSICIAN ASSISTANT:   ASSISTANTSBilly Coast, MD   ANESTHESIA:   spinal  EBL:  Total I/O In: 1900 [I.V.:1900] Out: 800 [Urine:200; Blood:600]  BLOOD ADMINISTERED:none  DRAINS: Urinary Catheter (Foley)   LOCAL MEDICATIONS USED:  NONE  SPECIMEN:  Source of Specimen:  Placenta, cord blood  DISPOSITION OF SPECIMEN:  PATHOLOGY  COUNTS:  YES  TOURNIQUET:  * No tourniquets in log *  DICTATION: .Dragon Dictation  PLAN OF CARE: Admit to inpatient   PATIENT DISPOSITION:  PACU - hemodynamically stable.   Delay start of Pharmacological VTE agent (>24hrs) due to surgical blood loss or risk of bleeding: yes

## 2013-01-20 LAB — CBC
HCT: 29.7 % — ABNORMAL LOW (ref 36.0–46.0)
Hemoglobin: 9.9 g/dL — ABNORMAL LOW (ref 12.0–15.0)
MCH: 29.5 pg (ref 26.0–34.0)
MCHC: 33.3 g/dL (ref 30.0–36.0)
MCV: 88.4 fL (ref 78.0–100.0)
RBC: 3.36 MIL/uL — ABNORMAL LOW (ref 3.87–5.11)

## 2013-01-20 NOTE — Progress Notes (Signed)
Subjective: Postpartum Day 1 s/p Primary Cesarean Delivery for severe IUGR and abn Dopplers.  Patient reports tolerating PO, + flatus and no problems voiding.  Denies pain. Has been to NICU to see the baby. Plan to breast feed, will work with pumping  Objective: Vital signs in last 24 hours: Temp:  [97.9 F (36.6 C)-99.3 F (37.4 C)] 98.3 F (36.8 C) (04/12 2144) Pulse Rate:  [74-96] 92 (04/12 2144) Resp:  [16-20] 19 (04/12 2144) BP: (112-152)/(68-90) 118/68 mmHg (04/12 2144) SpO2:  [98 %-100 %] 99 % (04/12 2144)  Physical Exam:  General: alert and cooperative Lochia: appropriate Uterine Fundus: firm Incision: healing well DVT Evaluation: No evidence of DVT seen on physical exam.   Recent Labs  01/19/13 1020 01/20/13 0530  HGB 12.0 9.9*  HCT 35.7* 29.7*  H/H dropped Rh(+), Rub Imm, Took TDap in 3rd trim.   Assessment/Plan: Status post Cesarean section. Doing well postoperatively.  Continue current care. Lactation assistance Continue Labetalol 100mg  tid.  Anemia- start Iron once PO tolerated.    Brittany Ware 01/20/2013, 9:53 PM

## 2013-01-21 ENCOUNTER — Encounter (HOSPITAL_COMMUNITY)
Admission: RE | Admit: 2013-01-21 | Discharge: 2013-01-21 | Disposition: A | Discharge status: Home / Self Care | Payer: BC Managed Care – PPO | Source: Ambulatory Visit | Attending: Obstetrics | Admitting: Obstetrics

## 2013-01-21 MED ORDER — IBUPROFEN 600 MG PO TABS: 600.0000 mg | ORAL_TABLET | Freq: Four times a day (QID) | ORAL | Status: DC

## 2013-01-21 MED ORDER — SENNOSIDES-DOCUSATE SODIUM 8.6-50 MG PO TABS: 2.0000 | ORAL_TABLET | Freq: Every day | ORAL | Status: DC

## 2013-01-21 MED ORDER — LABETALOL HCL 100 MG PO TABS: 100.0000 mg | ORAL_TABLET | Freq: Two times a day (BID) | ORAL | Status: DC

## 2013-01-21 MED ORDER — OXYCODONE-ACETAMINOPHEN 5-325 MG PO TABS: 1.0000 | ORAL_TABLET | Freq: Four times a day (QID) | ORAL | Status: DC | PRN

## 2013-01-21 NOTE — Clinical Social Work Note (Signed)
Clinical Social Work Department PSYCHOSOCIAL ASSESSMENT - MATERNAL/CHILD 01/20/2013  Patient:  Brittany Ware, Brittany Ware  Account Number:  [id number]  Admit Date:  01/19/2013  Brittany Ware Name:   Brittany Ware    Clinical Social Worker:  Brittany Hayward, LCSW   Date/Time:  01/21/2013 11:00 AM  Date Referred:  01/20/2013   Referral source  Physician     Referred reason  NICU   Other referral source:    I:  FAMILY / HOME ENVIRONMENT Child's legal guardian:  PARENT  Guardian - Name Guardian - Age Guardian - Address  Brittany Ware [address] [place], [place] [address]  Brittany Ware  [address] [place], [place] [address]   Other household support members/support persons Name Relationship DOB  none (first baby)     Other support:   MOB and FOB report good family support.    II  PSYCHOSOCIAL DATA Information Source:  Patient Interview  Event organiser Employment:   Surveyor, quantity resources:  Media planner If [employer] - [place]:    School / Grade:   Maternity Care Coordinator / Child Services Coordination / Early Interventions:  Cultural issues impacting care:    III  STRENGTHS Strengths  Adequate Resources  Home prepared for Child (including basic supplies)  Supportive family/friends  Compliance with medical plan  Understanding of illness   Strength comment:    IV  RISK FACTORS AND CURRENT PROBLEMS Current Problem:  None   Risk Factor & Current Problem Patient Issue Family Issue Risk Factor / Current Problem Comment   N N     V  SOCIAL WORK ASSESSMENT CSW spoke with MOB and FOB in room.  CSW discussed admission to NICU and understanding of illness.  MOB and FOB report good communication with MD's and nurses and understand current treatment.  CSW discussed any emotional concerns.  MOB and FOB reported appropriate emotion response to infant being in NICU.  CSW discussed PPD with MOB and symptoms to look out for.  No hx of MH concerns for MOB per chart.  No hx  of SA concerns per chart. CSW discussed support from SW while infant is in NICU.  CSW discussed supplies and family support.  MOB and FOB report good family support that live in area.  MOB and FOB report they have not bought everything for infant yet, however will let CSW know if any supply concerns arise for infant. CSW discussed infants qualifications for SSI and completed worksheet to start application.  CSW will continue to follow to offer support while infant is in NICU.      VI SOCIAL WORK PLAN Social Work Plan  Psychosocial Support/Ongoing Assessment of Needs   Type of pt/family education:   PPD symptoms  SSI qualifications   If child protective services report - county:   If child protective services report - date:   Information/referral to community resources comment:   Other social work plan:

## 2013-01-21 NOTE — Progress Notes (Signed)
Discharge instructions reviewed with patient.  Patient states understanding of home care, medications, activity, signs/symptoms to report to MD and return MD office visit.  Patient ambulated in stable condition for discharge.

## 2013-01-21 NOTE — Discharge Summary (Signed)
Obstetric Discharge Summary Reason for Admission: Cesarean delivery for severe IUGR, oligohydramnios, absent end diastolic flow. G1P0 at [redacted]w[redacted]d presenting for delivery. Pt has been followed over the past 3 wks for low fetal growth with elevated SD ratios. Pt was admitted for fetal monitoring and BMZ at 31 wks when office u/s showed IUGR with elevated SD ratios. After BMZ, SD ratios improved for about 1 wk and f/u u/s showed baby increased in wt to the 10%. Since that time, SD ratios increased to 97% and pt has now developed oligohydramnios and now intermittent absent SD ratios. BPP this am 6/8. MFM consult today recommended delivery at this time  Complications-Operative and Postpartum: none Hemoglobin  Date Value Range Status  01/20/2013 9.9* 12.0 - 15.0 g/dL Final     REPEATED TO VERIFY     DELTA CHECK NOTED     HCT  Date Value Range Status  01/20/2013 29.7* 36.0 - 46.0 % Final    Physical Exam:  General: alert and cooperative  Lochia: appropriate Uterine Fundus: firm Incision: healing well DVT Evaluation: No evidence of DVT seen on physical exam.  Discharge Diagnoses: Preterm Low transverse cesarean delivery on 01/19/13, baby in NICU, stable  Discharge Information: Date: 01/21/2013 Activity: pelvic rest Diet: routine Medications: PNV, Tylenol #3 and Ibuprofen, Iron Condition: stable Instructions: refer to practice specific booklet Discharge to: home   Newborn Data: Live born female  Birth Weight: 2 lb 15.6 oz (1350 g) APGAR: 9, 9 NICU, stable.  Brittany Ware R 01/21/2013, 11:19 AM

## 2013-01-21 NOTE — Progress Notes (Signed)
Subjective: Postpartum Day 2: Cesarean Delivery Patient reports tolerating PO, + flatus and no problems voiding.  Gas pain last night.  Lochia appropriate Objective: Vital signs in last 24 hours: Temp:  [97.9 F (36.6 C)-98.4 F (36.9 C)] 98.4 F (36.9 C) (04/13 0557) Pulse Rate:  [75-96] 94 (04/13 0557) Resp:  [18-20] 19 (04/12 2144) BP: (112-121)/(68-74) 121/72 mmHg (04/13 0557) SpO2:  [96 %-99 %] 96 % (04/13 0557)  Physical Exam:  General: alert and cooperative Lungs CTA bilat CV RRR Abdo soft NT, normal BS, non acute Uterine Fundus: firm Incision: healing well, no significant drainage, no significant erythema DVT Evaluation: No evidence of DVT seen on physical exam.   Recent Labs  01/19/13 1020 01/20/13 0530  HGB 12.0 9.9*  HCT 35.7* 29.7*    Assessment/Plan: Status post Cesarean section. Doing well postoperatively.  Discharge home with standard precautions and return to clinic in 4-6 weeks. Anemia, needs Iron CHTN in preg, decrease Labetalol to 100mg  bid and stop if BP consistently <120/80 Post op care reviewed TDap done in 3rd trim  Baby in NICU, but stable, is IUGR. Breast feeding planned, mother pumping  Wilene Pharo R 01/21/2013, 11:11 AM

## 2013-01-22 ENCOUNTER — Encounter (HOSPITAL_COMMUNITY): Payer: Self-pay | Admitting: Obstetrics

## 2013-03-07 ENCOUNTER — Ambulatory Visit (INDEPENDENT_AMBULATORY_CARE_PROVIDER_SITE_OTHER): Payer: BC Managed Care – PPO | Admitting: Family Medicine

## 2013-03-07 ENCOUNTER — Encounter: Payer: Self-pay | Admitting: Family Medicine

## 2013-03-07 VITALS — BP 134/82 | HR 79 | Temp 98.5°F | Wt 149.4 lb

## 2013-03-07 DIAGNOSIS — I10 Essential (primary) hypertension: Secondary | ICD-10-CM

## 2013-03-07 LAB — LIPID PANEL
Cholesterol: 145 mg/dL (ref 0–200)
LDL Cholesterol: 81 mg/dL (ref 0–99)

## 2013-03-07 LAB — CBC WITH DIFFERENTIAL/PLATELET
Basophils Relative: 0.5 % (ref 0.0–3.0)
Eosinophils Relative: 1.9 % (ref 0.0–5.0)
HCT: 36.8 % (ref 36.0–46.0)
Hemoglobin: 12 g/dL (ref 12.0–15.0)
Lymphs Abs: 2.4 10*3/uL (ref 0.7–4.0)
MCV: 87.7 fl (ref 78.0–100.0)
Monocytes Absolute: 0.5 10*3/uL (ref 0.1–1.0)
Neutro Abs: 5.3 10*3/uL (ref 1.4–7.7)
RBC: 4.2 Mil/uL (ref 3.87–5.11)
WBC: 8.5 10*3/uL (ref 4.5–10.5)

## 2013-03-07 LAB — BASIC METABOLIC PANEL
Chloride: 106 mEq/L (ref 96–112)
Potassium: 3.9 mEq/L (ref 3.5–5.1)

## 2013-03-07 LAB — HEPATIC FUNCTION PANEL
ALT: 18 U/L (ref 0–35)
Bilirubin, Direct: 0 mg/dL (ref 0.0–0.3)
Total Protein: 7.6 g/dL (ref 6.0–8.3)

## 2013-03-07 LAB — POCT URINALYSIS DIPSTICK
Protein, UA: NEGATIVE
Spec Grav, UA: >=1.03
Urobilinogen, UA: 0.2

## 2013-03-07 NOTE — Progress Notes (Signed)
  Subjective:    Patient here for follow-up of elevated blood pressure that started around 4 th month of pregnancy.   She is exercising and is adherent to a low-salt diet.  Blood pressure is well controlled at home. Cardiac symptoms: none. Patient denies: chest pain, chest pressure/discomfort, claudication, dyspnea, exertional chest pressure/discomfort, fatigue, irregular heart beat, lower extremity edema, near-syncope, orthopnea, palpitations, paroxysmal nocturnal dyspnea, syncope and tachypnea. Cardiovascular risk factors: hypertension. Use of agents associated with hypertension: none. History of target organ damage: none.  The following portions of the patient's history were reviewed and updated as appropriate:  She  has a past medical history of Genital warts; Genital HSV; and Hypertension. She  does not have any pertinent problems on file. She  has past surgical history that includes Cesarean section (N/A, 01/19/2013). Her family history includes Alcohol abuse in her father; Prostate cancer in her maternal grandfather; and Thyroid disease in her mother. She  reports that she has never smoked. She does not have any smokeless tobacco history on file. She reports that she does not drink alcohol or use illicit drugs. She has a current medication list which includes the following prescription(s): acyclovir, docosahexaenoic acid, ferrous sulfate, labetalol, oxycodone-acetaminophen, prenatal multivitamin, and zovirax. Current Outpatient Prescriptions on File Prior to Visit  Medication Sig Dispense Refill  . acyclovir (ZOVIRAX) 400 MG tablet Take 1 tablet (400 mg total) by mouth 3 (three) times daily.  90 tablet  5  . Docosahexaenoic Acid (DHA PO) Take 1 tablet by mouth daily.      . ferrous sulfate 325 (65 FE) MG tablet Take 325 mg by mouth daily with breakfast.      . labetalol (NORMODYNE) 100 MG tablet Take 1 tablet (100 mg total) by mouth 2 (two) times daily.  30 tablet  0  . oxyCODONE-acetaminophen  (PERCOCET/ROXICET) 5-325 MG per tablet Take 1-2 tablets by mouth every 6 (six) hours as needed.  30 tablet  0  . Prenatal Vit-Fe Fumarate-FA (PRENATAL MULTIVITAMIN) TABS Take 1 tablet by mouth daily at 12 noon.       No current facility-administered medications on file prior to visit.   She has No Known Allergies..  Review of Systems Pertinent items are noted in HPI.     Objective:    BP 134/82  Pulse 79  Temp(Src) 98.5 F (36.9 C) (Oral)  Wt 149 lb 6.4 oz (67.767 kg)  BMI 29.18 kg/m2  SpO2 99%  Breastfeeding? Yes General appearance: alert, cooperative, appears stated age and no distress Lungs: clear to auscultation bilaterally Heart: S1, S2 normal Extremities: extremities normal, atraumatic, no cyanosis or edema    Assessment:    Hypertension, normal blood pressure . Evidence of target organ damage: none.    Plan:    Medication: no change. Dietary sodium restriction. Regular aerobic exercise. Check blood pressures 2-3 times weekly and record. Follow up: 1 month and as needed.

## 2013-03-07 NOTE — Patient Instructions (Signed)

## 2013-04-04 ENCOUNTER — Telehealth: Payer: Self-pay | Admitting: Family Medicine

## 2013-04-04 ENCOUNTER — Encounter: Payer: Self-pay | Admitting: Family Medicine

## 2013-04-04 ENCOUNTER — Ambulatory Visit (INDEPENDENT_AMBULATORY_CARE_PROVIDER_SITE_OTHER): Payer: BC Managed Care – PPO | Admitting: Family Medicine

## 2013-04-04 VITALS — BP 139/98 | HR 90 | Temp 98.5°F | Wt 148.2 lb

## 2013-04-04 DIAGNOSIS — O10919 Unspecified pre-existing hypertension complicating pregnancy, unspecified trimester: Secondary | ICD-10-CM

## 2013-04-04 DIAGNOSIS — I10 Essential (primary) hypertension: Secondary | ICD-10-CM

## 2013-04-04 MED ORDER — LABETALOL HCL 100 MG PO TABS: ORAL_TABLET | ORAL | Status: DC

## 2013-04-04 NOTE — Progress Notes (Signed)
  Subjective:    Patient here for follow-up of elevated blood pressure. It has been normal at home , usually low since delivering.  She is exercising and is adherent to a low-salt diet.  Blood pressure is well controlled at home. Cardiac symptoms: none. Patient denies: chest pain, chest pressure/discomfort, claudication, dyspnea, exertional chest pressure/discomfort, fatigue, irregular heart beat, lower extremity edema, near-syncope, orthopnea, palpitations, paroxysmal nocturnal dyspnea, syncope and tachypnea. Cardiovascular risk factors: none. Use of agents associated with hypertension: none. History of target organ damage: none.  The following portions of the patient's history were reviewed and updated as appropriate: allergies, current medications, past family history, past medical history, past social history, past surgical history and problem list.  Review of Systems Pertinent items are noted in HPI.     Objective:    BP 139/98  Pulse 90  Temp(Src) 98.5 F (36.9 C) (Oral)  Wt 148 lb 3.2 oz (67.223 kg)  BMI 28.94 kg/m2  SpO2 99% General appearance: alert, cooperative, appears stated age and no distress Neck: no adenopathy, supple, symmetrical, trachea midline and thyroid not enlarged, symmetric, no tenderness/mass/nodules Lungs: clear to auscultation bilaterally Heart: S1, S2 normal Extremities: extremities normal, atraumatic, no cyanosis or edema    Assessment:    Hypertension, normal blood pressure . Evidence of target organ damage: none.    Plan:    Medication: decrease to 1/2 tab labetolol bid. Dietary sodium restriction. Regular aerobic exercise. Check blood pressures 2-3 times weekly and record. Follow up: 2 weeks and as needed.

## 2013-04-04 NOTE — Patient Instructions (Signed)

## 2013-04-25 ENCOUNTER — Encounter: Payer: Self-pay | Admitting: Family Medicine

## 2013-04-25 ENCOUNTER — Ambulatory Visit (INDEPENDENT_AMBULATORY_CARE_PROVIDER_SITE_OTHER): Payer: BC Managed Care – PPO | Admitting: Family Medicine

## 2013-04-25 VITALS — BP 128/80 | HR 97 | Temp 98.8°F | Wt 148.0 lb

## 2013-04-25 DIAGNOSIS — O10919 Unspecified pre-existing hypertension complicating pregnancy, unspecified trimester: Secondary | ICD-10-CM

## 2013-04-25 DIAGNOSIS — I10 Essential (primary) hypertension: Secondary | ICD-10-CM

## 2013-04-25 DIAGNOSIS — M654 Radial styloid tenosynovitis [de Quervain]: Secondary | ICD-10-CM

## 2013-04-25 MED ORDER — LABETALOL HCL 100 MG PO TABS: ORAL_TABLET | ORAL | Status: DC

## 2013-04-25 NOTE — Progress Notes (Signed)
  Subjective:    Patient here for follow-up of elevated blood pressure.  She is exercising and is adherent to a low-salt diet.  Blood pressure is well controlled at home. Cardiac symptoms: none. Patient denies: chest pain, chest pressure/discomfort, claudication, dyspnea, exertional chest pressure/discomfort, fatigue, irregular heart beat, lower extremity edema, near-syncope, orthopnea, palpitations, paroxysmal nocturnal dyspnea, syncope and tachypnea. Cardiovascular risk factors: none. Use of agents associated with hypertension: none. History of target organ damage: none.  bp home-- low 111/69 -  121/80 ---when it is low she feels very dizzy.    The following portions of the patient's history were reviewed and updated as appropriate: allergies, current medications, past family history, past medical history, past social history, past surgical history and problem list.  Review of Systems Pertinent items are noted in HPI.     Objective:    BP 128/80  Pulse 97  Temp(Src) 98.8 F (37.1 C) (Oral)  Wt 148 lb (67.132 kg)  BMI 28.9 kg/m2  SpO2 98%  Breastfeeding? No General appearance: alert, cooperative, appears stated age and no distress Throat: lips, mucosa, and tongue normal; teeth and gums normal Lungs: clear to auscultation bilaterally Heart: S1, S2 normal Extremities: no edema,  + pain along R thumb and thenar emminence    Assessment:    Hypertension, normal blood pressure . Evidence of target organ damage: none.    Plan:    Medication: wean off labetolol. Dietary sodium restriction. Regular aerobic exercise. Check blood pressures 1-2 times daily and record. Follow up: 4 weeks and as needed.

## 2013-04-25 NOTE — Assessment & Plan Note (Signed)
Thumb spica splint nsaids  Call or rto prn

## 2013-04-25 NOTE — Patient Instructions (Addendum)

## 2013-05-23 ENCOUNTER — Ambulatory Visit: Payer: Self-pay | Admitting: Family Medicine

## 2013-05-24 ENCOUNTER — Encounter: Payer: Self-pay | Admitting: Family Medicine

## 2013-05-24 ENCOUNTER — Ambulatory Visit (INDEPENDENT_AMBULATORY_CARE_PROVIDER_SITE_OTHER): Payer: BC Managed Care – PPO | Admitting: Family Medicine

## 2013-05-24 VITALS — BP 132/84 | HR 107 | Temp 98.4°F | Wt 148.0 lb

## 2013-05-24 DIAGNOSIS — I10 Essential (primary) hypertension: Secondary | ICD-10-CM

## 2013-05-24 DIAGNOSIS — R000 Tachycardia, unspecified: Secondary | ICD-10-CM

## 2013-05-24 NOTE — Progress Notes (Signed)
  Subjective:    Patient here for follow-up of elevated blood pressure.  She is not exercising and is adherent to a low-salt diet.  Blood pressure is well controlled at home. Cardiac symptoms: none. Patient denies: chest pain, chest pressure/discomfort, claudication, dyspnea, exertional chest pressure/discomfort, fatigue, irregular heart beat, lower extremity edema, near-syncope, orthopnea, palpitations, paroxysmal nocturnal dyspnea, syncope and tachypnea. Cardiovascular risk factors: hypertension and sedentary lifestyle. Use of agents associated with hypertension: none. History of target organ damage: none.  The following portions of the patient's history were reviewed and updated as appropriate: allergies, current medications, past family history, past medical history, past social history, past surgical history and problem list.  Review of Systems Pertinent items are noted in HPI.     Objective:    BP 132/84  Pulse 107  Temp(Src) 98.4 F (36.9 C) (Oral)  Wt 148 lb (67.132 kg)  BMI 28.9 kg/m2  SpO2 98% General appearance: alert, cooperative, appears stated age and no distress Neck: no adenopathy, no carotid bruit, no JVD, supple, symmetrical, trachea midline and thyroid not enlarged, symmetric, no tenderness/mass/nodules Lungs: clear to auscultation bilaterally Heart: + S1S2  , tachy, no murmur Extremities: extremities normal, atraumatic, no cyanosis or edema    Assessment:    Hypertension, stage 1 . Evidence of target organ damage: none.    Plan:    check echo Off meds con't monitor at home F/u 3 months

## 2013-05-24 NOTE — Patient Instructions (Addendum)

## 2013-06-06 ENCOUNTER — Other Ambulatory Visit (HOSPITAL_COMMUNITY): Payer: Self-pay

## 2013-06-06 ENCOUNTER — Ambulatory Visit (HOSPITAL_COMMUNITY): Payer: BC Managed Care – PPO | Attending: Family Medicine

## 2013-06-06 DIAGNOSIS — R000 Tachycardia, unspecified: Secondary | ICD-10-CM | POA: Insufficient documentation

## 2013-06-06 DIAGNOSIS — I379 Nonrheumatic pulmonary valve disorder, unspecified: Secondary | ICD-10-CM | POA: Insufficient documentation

## 2013-06-06 DIAGNOSIS — I10 Essential (primary) hypertension: Secondary | ICD-10-CM | POA: Insufficient documentation

## 2013-06-06 NOTE — Progress Notes (Signed)
Echocardiogram performed.  

## 2013-06-07 ENCOUNTER — Telehealth: Payer: Self-pay

## 2013-06-07 MED ORDER — METOPROLOL SUCCINATE ER 25 MG PO TB24: 25.0000 mg | ORAL_TABLET | Freq: Every day | ORAL | Status: DC

## 2013-06-07 NOTE — Telephone Encounter (Signed)
Message copied by Arnette Norris on Thu Jun 07, 2013  9:03 AM ------      Message from: Lelon Perla      Created: Wed Jun 06, 2013  7:40 PM       Pt heart rate up the entire time----  Start toprol xl 25 mg 1 po qd #30  And f/u 2 weeks ------

## 2013-06-07 NOTE — Telephone Encounter (Signed)
Spoke with patient and she voiced understanding. Rx sent and apt setup.     KP

## 2013-06-12 NOTE — Telephone Encounter (Signed)
error 

## 2013-06-29 ENCOUNTER — Encounter: Payer: Self-pay | Admitting: Family Medicine

## 2013-06-29 ENCOUNTER — Ambulatory Visit (INDEPENDENT_AMBULATORY_CARE_PROVIDER_SITE_OTHER): Payer: BC Managed Care – PPO | Admitting: Family Medicine

## 2013-06-29 VITALS — BP 138/90 | HR 95 | Temp 98.6°F | Wt 149.2 lb

## 2013-06-29 DIAGNOSIS — I10 Essential (primary) hypertension: Secondary | ICD-10-CM

## 2013-06-29 DIAGNOSIS — R000 Tachycardia, unspecified: Secondary | ICD-10-CM

## 2013-06-29 MED ORDER — METOPROLOL SUCCINATE ER 50 MG PO TB24: 50.0000 mg | ORAL_TABLET | Freq: Every day | ORAL | Status: DC

## 2013-06-29 NOTE — Patient Instructions (Addendum)

## 2013-06-29 NOTE — Assessment & Plan Note (Signed)
Inc metoprolol xl 50 mg 1 po qd

## 2013-06-29 NOTE — Progress Notes (Signed)
  Subjective:    Patient here for follow-up of elevated blood pressure.  She is not exercising and is adherent to a low-salt diet.  Blood pressure is not well controlled at home. Cardiac symptoms: none. Patient denies: chest pain, chest pressure/discomfort, claudication, dyspnea, exertional chest pressure/discomfort, fatigue, irregular heart beat, lower extremity edema, near-syncope, orthopnea, palpitations, paroxysmal nocturnal dyspnea, syncope and tachypnea. Cardiovascular risk factors: hypertension and sedentary lifestyle. Use of agents associated with hypertension: none. History of target organ damage: none.  The following portions of the patient's history were reviewed and updated as appropriate: allergies, current medications, past family history, past medical history, past social history, past surgical history and problem list.  Review of Systems Pertinent items are noted in HPI.     Objective:    BP 138/90  Pulse 95  Temp(Src) 98.6 F (37 C) (Oral)  Wt 149 lb 3.2 oz (67.677 kg)  BMI 29.14 kg/m2  SpO2 99% General appearance: alert, cooperative, appears stated age and no distress Lungs: clear to auscultation bilaterally Heart: S1, S2 normal Extremities: extremities normal, atraumatic, no cyanosis or edema    Assessment:    Hypertension, stage 1 . Evidence of target organ damage: none.    Plan:    Medication: increase to toprol xl 50 mg. Dietary sodium restriction. Regular aerobic exercise. Check blood pressures 2-3 times weekly and record. Follow up: 3 months and as needed.

## 2013-10-08 ENCOUNTER — Ambulatory Visit: Payer: Self-pay | Admitting: Family Medicine

## 2013-10-08 DIAGNOSIS — Z0289 Encounter for other administrative examinations: Secondary | ICD-10-CM

## 2014-08-12 ENCOUNTER — Encounter: Payer: Self-pay | Admitting: Family Medicine

## 2015-01-28 ENCOUNTER — Encounter: Payer: Self-pay | Admitting: Physician Assistant

## 2015-01-28 ENCOUNTER — Ambulatory Visit (INDEPENDENT_AMBULATORY_CARE_PROVIDER_SITE_OTHER): Payer: BC Managed Care – PPO | Admitting: Physician Assistant

## 2015-01-28 VITALS — BP 137/85 | HR 120 | Temp 98.4°F | Resp 16 | Ht 60.0 in | Wt 154.1 lb

## 2015-01-28 DIAGNOSIS — J020 Streptococcal pharyngitis: Secondary | ICD-10-CM

## 2015-01-28 MED ORDER — AMOXICILLIN 500 MG PO CAPS: 500.0000 mg | ORAL_CAPSULE | Freq: Two times a day (BID) | ORAL | Status: DC

## 2015-01-28 NOTE — Assessment & Plan Note (Signed)
Rapid strep positive. Will begin Amoxicillin twice daily x 10 days.  Supportive measures discussed.  Follow-up if symptoms are not improving.

## 2015-01-28 NOTE — Progress Notes (Signed)
   Patient presents to clinic today c/o sore throat, odynophagia, headaches, chills and aches x 3 days. Also endorses significant fatigue. Endorses son is sick at home with both strep throat and hand/foot/mouth disease. Denies fever. Has taken Nyquil for symptoms without relief.  Past Medical History  Diagnosis Date  . Genital warts   . Genital HSV   . Hypertension     Current Outpatient Prescriptions on File Prior to Visit  Medication Sig Dispense Refill  . acyclovir (ZOVIRAX) 400 MG tablet Take 1 tablet (400 mg total) by mouth 3 (three) times daily. (Patient taking differently: Take 400 mg by mouth 3 (three) times daily as needed. ) 90 tablet 5   No current facility-administered medications on file prior to visit.    No Known Allergies  Family History  Problem Relation Age of Onset  . Alcohol abuse Father   . Prostate cancer Maternal Grandfather   . Thyroid disease Mother     History   Social History  . Marital Status: Married    Spouse Name: N/A  . Number of Children: N/A  . Years of Education: N/A   Occupational History  . TA-- [employer]    Social History Main Topics  . Smoking status: Never Smoker   . Smokeless tobacco: Not on file  . Alcohol Use: No     Comment: 2-3 x a month  . Drug Use: No  . Sexual Activity:    Partners: Male    Birth Control/ Protection: IUD   Other Topics Concern  . None   Social History Narrative   ** Merged History Encounter **       Review of Systems - See HPI.  All other ROS are negative.  BP 137/85 mmHg  Pulse 120  Temp(Src) 98.4 F (36.9 C) (Oral)  Resp 16  Ht 5' (1.524 m)  Wt 154 lb 2 oz (69.911 kg)  BMI 30.10 kg/m2  SpO2 99%  LMP 01/05/2015  Physical Exam  Constitutional: She is oriented to person, place, and time and well-developed, well-nourished, and in no distress.  HENT:  Head: Normocephalic and atraumatic.  Right Ear: Tympanic membrane normal.  Left Ear: Tympanic membrane normal.    Mouth/Throat: Uvula is midline and mucous membranes are normal. Oropharyngeal exudate and posterior oropharyngeal erythema present. No posterior oropharyngeal edema or tonsillar abscesses.  Eyes: Conjunctivae are normal.  Neck: Neck supple.  Cardiovascular: Normal rate, regular rhythm, normal heart sounds and intact distal pulses.   Pulmonary/Chest: Effort normal and breath sounds normal. No respiratory distress. She has no wheezes. She has no rales. She exhibits no tenderness.  Lymphadenopathy:    She has cervical adenopathy.  Neurological: She is alert and oriented to person, place, and time.  Skin: Skin is warm and dry. No rash noted.  Psychiatric: Affect normal.  Vitals reviewed.  Assessment/Plan: Streptococcal sore throat Rapid strep positive. Will begin Amoxicillin twice daily x 10 days.  Supportive measures discussed.  Follow-up if symptoms are not improving.

## 2015-01-28 NOTE — Patient Instructions (Signed)
Please take antibiotic as directed with food. Increase fluid intake. Place a humidifier in the bedroom. Alternate Tylenol and Ibuprofen for pain and fever (if present). Salt water gargles may also be beneficial.  Remember to throw away old tooth brush after being on antibiotic for a few days.  Strep Throat Strep throat is an infection of the throat caused by a bacteria named Streptococcus pyogenes. Your health care provider may call the infection streptococcal "tonsillitis" or "pharyngitis" depending on whether there are signs of inflammation in the tonsils or back of the throat. Strep throat is most common in children aged 5-15 years during the cold months of the year, but it can occur in people of any age during any season. This infection is spread from person to person (contagious) through coughing, sneezing, or other close contact. SIGNS AND SYMPTOMS   Fever or chills.  Painful, swollen, red tonsils or throat.  Pain or difficulty when swallowing.  White or yellow spots on the tonsils or throat.  Swollen, tender lymph nodes or "glands" of the neck or under the jaw.  Red rash all over the body (rare). DIAGNOSIS  Many different infections can cause the same symptoms. A test must be done to confirm the diagnosis so the right treatment can be given. A "rapid strep test" can help your health care provider make the diagnosis in a few minutes. If this test is not available, a light swab of the infected area can be used for a throat culture test. If a throat culture test is done, results are usually available in a day or two. TREATMENT  Strep throat is treated with antibiotic medicine. HOME CARE INSTRUCTIONS   Gargle with 1 tsp of salt in 1 cup of warm water, 3-4 times per day or as needed for comfort.  Family members who also have a sore throat or fever should be tested for strep throat and treated with antibiotics if they have the strep infection.  Make sure everyone in your household  washes their hands well.  Do not share food, drinking cups, or personal items that could cause the infection to spread to others.  You may need to eat a soft food diet until your sore throat gets better.  Drink enough water and fluids to keep your urine clear or pale yellow. This will help prevent dehydration.  Get plenty of rest.  Stay home from school, day care, or work until you have been on antibiotics for 24 hours.  Take medicines only as directed by your health care provider.  Take your antibiotic medicine as directed by your health care provider. Finish it even if you start to feel better. SEEK MEDICAL CARE IF:   The glands in your neck continue to enlarge.  You develop a rash, cough, or earache.  You cough up green, yellow-brown, or bloody sputum.  You have pain or discomfort not controlled by medicines.  Your problems seem to be getting worse rather than better.  You have a fever. SEEK IMMEDIATE MEDICAL CARE IF:   You develop any new symptoms such as vomiting, severe headache, stiff or painful neck, chest pain, shortness of breath, or trouble swallowing.  You develop severe throat pain, drooling, or changes in your voice.  You develop swelling of the neck, or the skin on the neck becomes red and tender.  You develop signs of dehydration, such as fatigue, dry mouth, and decreased urination.  You become increasingly sleepy, or you cannot wake up completely. MAKE SURE YOU:  Understand these instructions.  Will watch your condition.  Will get help right away if you are not doing well or get worse. Document Released: 09/24/2000 Document Revised: 02/11/2014 Document Reviewed: 11/26/2010 [facility] Patient Information 2015 [place]. This information is not intended to replace advice given to you by your health care provider. Make sure you discuss any questions you have with your health care provider.

## 2015-06-19 ENCOUNTER — Other Ambulatory Visit: Payer: Self-pay | Admitting: Obstetrics

## 2015-06-19 DIAGNOSIS — E049 Nontoxic goiter, unspecified: Secondary | ICD-10-CM

## 2015-06-20 ENCOUNTER — Other Ambulatory Visit: Payer: Self-pay

## 2015-06-26 ENCOUNTER — Ambulatory Visit
Admission: RE | Admit: 2015-06-26 | Discharge: 2015-06-26 | Disposition: A | Discharge status: Home / Self Care | Payer: BC Managed Care – PPO | Source: Ambulatory Visit | Attending: Obstetrics | Admitting: Obstetrics

## 2015-06-26 DIAGNOSIS — E049 Nontoxic goiter, unspecified: Secondary | ICD-10-CM

## 2015-07-28 LAB — OB RESULTS CONSOLE RPR: RPR: NONREACTIVE

## 2015-07-28 LAB — OB RESULTS CONSOLE ABO/RH: RH Type: POSITIVE

## 2015-07-28 LAB — OB RESULTS CONSOLE ANTIBODY SCREEN: Antibody Screen: NEGATIVE

## 2015-07-28 LAB — OB RESULTS CONSOLE GC/CHLAMYDIA
CHLAMYDIA, DNA PROBE: NEGATIVE
GC PROBE AMP, GENITAL: NEGATIVE

## 2015-07-28 LAB — OB RESULTS CONSOLE RUBELLA ANTIBODY, IGM: RUBELLA: IMMUNE

## 2015-07-28 LAB — OB RESULTS CONSOLE HIV ANTIBODY (ROUTINE TESTING): HIV: NONREACTIVE

## 2015-07-28 LAB — OB RESULTS CONSOLE HEPATITIS B SURFACE ANTIGEN: Hepatitis B Surface Ag: NEGATIVE

## 2016-02-04 LAB — OB RESULTS CONSOLE GBS: STREP GROUP B AG: POSITIVE

## 2016-02-13 ENCOUNTER — Encounter (HOSPITAL_COMMUNITY): Payer: Self-pay

## 2016-02-20 ENCOUNTER — Encounter (HOSPITAL_COMMUNITY)
Admission: RE | Admit: 2016-02-20 | Discharge: 2016-02-20 | Disposition: A | Discharge status: Home / Self Care | Payer: BC Managed Care – PPO | Source: Ambulatory Visit | Attending: Obstetrics & Gynecology | Admitting: Obstetrics & Gynecology

## 2016-02-20 LAB — CBC
HEMATOCRIT: 34.7 % — AB (ref 36.0–46.0)
HEMOGLOBIN: 11.5 g/dL — AB (ref 12.0–15.0)
MCH: 26.9 pg (ref 26.0–34.0)
MCHC: 33.1 g/dL (ref 30.0–36.0)
MCV: 81.1 fL (ref 78.0–100.0)
PLATELETS: 167 10*3/uL (ref 150–400)
RBC: 4.28 MIL/uL (ref 3.87–5.11)
RDW: 15 % (ref 11.5–15.5)
WBC: 12.7 10*3/uL — AB (ref 4.0–10.5)

## 2016-02-20 LAB — BASIC METABOLIC PANEL
Anion gap: 9 (ref 5–15)
BUN: 9 mg/dL (ref 6–20)
CHLORIDE: 107 mmol/L (ref 101–111)
CO2: 20 mmol/L — AB (ref 22–32)
Calcium: 9.1 mg/dL (ref 8.9–10.3)
Creatinine, Ser: 0.68 mg/dL (ref 0.44–1.00)
GFR calc Af Amer: >60 mL/min (ref >60)
GFR calc non Af Amer: >60 mL/min (ref >60)
GLUCOSE: 91 mg/dL (ref 65–99)
POTASSIUM: 4.1 mmol/L (ref 3.5–5.1)
Sodium: 136 mmol/L (ref 135–145)

## 2016-02-20 NOTE — Patient Instructions (Signed)
20 Brittany Ware  02/20/2016   Your procedure is scheduled on:  02/23/2016  [facility].  Pick up the phone at the desk and dial [phone].   Call this number if you have problems the morning of surgery: [phone]   Remember:   Do not eat food:After Midnight.  Do not drink clear liquids: After Midnight.  Take these medicines the morning of surgery with A SIP OF WATER: none   Do not wear jewelry, make-up or nail polish.  Do not wear lotions, powders, or perfumes. You may wear deodorant.  Do not shave 48 hours prior to surgery.  Do not bring valuables to the hospital.  [facility] is not   responsible for any belongings or valuables brought to the hospital.  Contacts, dentures or bridgework may not be worn into surgery.  Leave suitcase in the car. After surgery it may be brought to your room.  For patients admitted to the hospital, checkout time is 11:00 AM the day of              discharge.   Patients discharged the day of surgery will not be allowed to drive             home.  Name and phone number of your driver: na  Special Instructions:   Shower using CHG 2 nights before surgery and the night before surgery.  If you shower the day of surgery use CHG.  Use special wash - you have one bottle of CHG for all showers.  You should use approximately 1/3 of the bottle for each shower.   Please read over the following fact sheets that you were given:   Surgical Site Infection Prevention

## 2016-02-21 LAB — RPR: RPR Ser Ql: NONREACTIVE

## 2016-02-22 NOTE — H&P (Signed)
Brittany Ware is a 35 y.o. female presenting for repeat C/S at 39 weeks. Patient has had one prior C/S and desire sterility.  The patient has h/o HSV and has been given ppx with Valtrex. She has a h/o PTD at 34 weeks for IUGR; nl U/S this pregnancy.  GBS positive.   Maternal Medical History:  Prenatal complications: no prenatal complications Prenatal Complications - Diabetes: none.    OB History    Gravida Para Term Preterm AB TAB SAB Ectopic Multiple Living   2 1  1      1      Past Medical History  Diagnosis Date  . Genital warts   . Genital HSV   . Hypertension    Past Surgical History  Procedure Laterality Date  . Cesarean section N/A 01/19/2013    Procedure: CESAREAN SECTION;  Surgeon: Tresa EndoKelly A. Ernestina PennaFogleman, MD;  Location: [facility];  Service: Obstetrics;  Laterality: N/A;  primary    Family History: family history includes Alcohol abuse in her father; Prostate cancer in her maternal grandfather; Thyroid disease in her mother. Social History:  reports that she has never smoked. She does not have any smokeless tobacco history on file. She reports that she does not drink alcohol or use illicit drugs.   Prenatal Transfer Tool  Maternal Diabetes: No Genetic Screening: Normal Maternal Ultrasounds/Referrals: Normal Fetal Ultrasounds or other Referrals:  None Maternal Substance Abuse:  No Significant Maternal Medications:  None Significant Maternal Lab Results:  Lab values include: Group B Strep positive Other Comments:  None  ROS    There were no vitals taken for this visit. Maternal Exam:  Abdomen: Patient reports no abdominal tenderness. Surgical scars: low transverse.   Fundal height is S<D.   Estimated fetal weight is 6#.       Physical Exam  Constitutional: She is oriented to person, place, and time. She appears well-developed and well-nourished.  GI: Soft. There is no rebound and no guarding.  Neurological: She is alert and oriented to person, place, and time.   Skin: Skin is warm and dry.  Psychiatric: She has a normal mood and affect. Her behavior is normal.    Prenatal labs: ABO, Rh: --/--/O POS (05/12 1045) Antibody: NEG (05/12 1045) Rubella: Immune (10/17 0000) RPR: Non Reactive (05/12 1045)  HBsAg: Negative (10/17 0000)  HIV: Non-reactive (10/17 0000)  GBS: Positive (04/26 0000)   Assessment/Plan: 34yo G2P0101 at 39 weeks with previous C/S and desiring sterility. -Rpt C/S with B/L salpingectomy -Patient is counseled re: risk of bleeding, infection, scarring and damage to surrounding structures.  She understands the risk of failure, permanence, and regret re: salpingectomy.  All questions were answered and the patient wishes to proceed.  Brittany Ware 02/22/2016, 5:43 PM

## 2016-02-22 NOTE — Anesthesia Preprocedure Evaluation (Addendum)
Anesthesia Evaluation  Patient identified by MRN, date of birth, ID band Patient awake    Reviewed: Allergy & Precautions, H&P , NPO status , Patient's Chart, lab work & pertinent test results  Airway Mallampati: IV  TM Distance: >3 FB Neck ROM: limited   Comment: Large tongue Dental  (+) Teeth Intact, Dental Advisory Given   Pulmonary neg pulmonary ROS,    Pulmonary exam normal        Cardiovascular hypertension, negative cardio ROS Normal cardiovascular exam     Neuro/Psych negative neurological ROS  negative psych ROS   GI/Hepatic negative GI ROS, Neg liver ROS,   Endo/Other  negative endocrine ROS  Renal/GU negative Renal ROS     Musculoskeletal   Abdominal   Peds  Hematology negative hematology ROS (+)   Anesthesia Other Findings   Reproductive/Obstetrics (+) Pregnancy                            Anesthesia Physical  Anesthesia Plan  ASA: II  Anesthesia Plan: Spinal   Post-op Pain Management:    Induction:   Airway Management Planned: Natural Airway  Additional Equipment:   Intra-op Plan:   Post-operative Plan:   Informed Consent: I have reviewed the patients History and Physical, chart, labs and discussed the procedure including the risks, benefits and alternatives for the proposed anesthesia with the patient or authorized representative who has indicated his/her understanding and acceptance.   Dental advisory given  Plan Discussed with: Anesthesiologist and CRNA  Anesthesia Plan Comments:        Anesthesia Quick Evaluation

## 2016-02-23 ENCOUNTER — Inpatient Hospital Stay (HOSPITAL_COMMUNITY): Payer: BC Managed Care – PPO | Admitting: Anesthesiology

## 2016-02-23 ENCOUNTER — Inpatient Hospital Stay (HOSPITAL_COMMUNITY)
Admission: RE | Admit: 2016-02-23 | Discharge: 2016-02-25 | DRG: 765 | Disposition: A | Discharge status: Home / Self Care | Payer: BC Managed Care – PPO | Source: Ambulatory Visit | Attending: Obstetrics & Gynecology | Admitting: Obstetrics & Gynecology

## 2016-02-23 ENCOUNTER — Encounter (HOSPITAL_COMMUNITY): Payer: Self-pay | Admitting: Certified Registered Nurse Anesthetist

## 2016-02-23 ENCOUNTER — Encounter (HOSPITAL_COMMUNITY)
Admission: RE | Disposition: A | Discharge status: Home / Self Care | Payer: Self-pay | Source: Ambulatory Visit | Attending: Obstetrics & Gynecology

## 2016-02-23 DIAGNOSIS — Z3A39 39 weeks gestation of pregnancy: Secondary | ICD-10-CM

## 2016-02-23 DIAGNOSIS — A630 Anogenital (venereal) warts: Secondary | ICD-10-CM | POA: Diagnosis present

## 2016-02-23 DIAGNOSIS — Z811 Family history of alcohol abuse and dependence: Secondary | ICD-10-CM | POA: Diagnosis not present

## 2016-02-23 DIAGNOSIS — Z8042 Family history of malignant neoplasm of prostate: Secondary | ICD-10-CM | POA: Diagnosis not present

## 2016-02-23 DIAGNOSIS — O9832 Other infections with a predominantly sexual mode of transmission complicating childbirth: Secondary | ICD-10-CM | POA: Diagnosis present

## 2016-02-23 DIAGNOSIS — A6000 Herpesviral infection of urogenital system, unspecified: Secondary | ICD-10-CM | POA: Diagnosis present

## 2016-02-23 DIAGNOSIS — O34211 Maternal care for low transverse scar from previous cesarean delivery: Secondary | ICD-10-CM | POA: Diagnosis present

## 2016-02-23 DIAGNOSIS — Z302 Encounter for sterilization: Secondary | ICD-10-CM | POA: Diagnosis not present

## 2016-02-23 DIAGNOSIS — Z98891 History of uterine scar from previous surgery: Secondary | ICD-10-CM

## 2016-02-23 DIAGNOSIS — O99824 Streptococcus B carrier state complicating childbirth: Secondary | ICD-10-CM | POA: Diagnosis present

## 2016-02-23 HISTORY — PX: BILATERAL SALPINGECTOMY: SHX5743

## 2016-02-23 LAB — PREPARE RBC (CROSSMATCH)

## 2016-02-23 SURGERY — Surgical Case
Anesthesia: Spinal | Wound class: Clean Contaminated

## 2016-02-23 MED ORDER — MORPHINE SULFATE (PF) 0.5 MG/ML IJ SOLN
INTRAMUSCULAR | Status: DC | PRN
  Administered 2016-02-23: .2 mg via INTRATHECAL

## 2016-02-23 MED ORDER — NALBUPHINE HCL 10 MG/ML IJ SOLN: 5.0000 mg | Freq: Once | INTRAMUSCULAR | Status: DC | PRN

## 2016-02-23 MED ORDER — TETANUS-DIPHTH-ACELL PERTUSSIS 5-2.5-18.5 LF-MCG/0.5 IM SUSP: 0.5000 mL | Freq: Once | INTRAMUSCULAR | Status: DC

## 2016-02-23 MED ORDER — PHENYLEPHRINE 8 MG IN D5W 100 ML (0.08MG/ML) PREMIX OPTIME
INJECTION | INTRAVENOUS | Status: DC | PRN
  Administered 2016-02-23: 60 ug/min via INTRAVENOUS

## 2016-02-23 MED ORDER — ZOLPIDEM TARTRATE 5 MG PO TABS: 5.0000 mg | ORAL_TABLET | Freq: Every evening | ORAL | Status: DC | PRN

## 2016-02-23 MED ORDER — HYDROMORPHONE HCL 1 MG/ML IJ SOLN: 0.2500 mg | INTRAMUSCULAR | Status: DC | PRN

## 2016-02-23 MED ORDER — WITCH HAZEL-GLYCERIN EX PADS: 1.0000 "application " | MEDICATED_PAD | CUTANEOUS | Status: DC | PRN

## 2016-02-23 MED ORDER — KETOROLAC TROMETHAMINE 30 MG/ML IJ SOLN: 30.0000 mg | Freq: Four times a day (QID) | INTRAMUSCULAR | Status: DC | PRN

## 2016-02-23 MED ORDER — ONDANSETRON HCL 4 MG/2ML IJ SOLN
INTRAMUSCULAR | Status: AC
  Filled 2016-02-23: qty 2

## 2016-02-23 MED ORDER — SIMETHICONE 80 MG PO CHEW
80.0000 mg | CHEWABLE_TABLET | ORAL | Status: DC
  Administered 2016-02-24 (×2): 80 mg via ORAL
  Filled 2016-02-23 (×2): qty 1

## 2016-02-23 MED ORDER — NALOXONE HCL 2 MG/2ML IJ SOSY
1.0000 ug/kg/h | PREFILLED_SYRINGE | INTRAVENOUS | Status: DC | PRN
  Filled 2016-02-23: qty 2

## 2016-02-23 MED ORDER — CEFAZOLIN SODIUM-DEXTROSE 2-4 GM/100ML-% IV SOLN
2.0000 g | INTRAVENOUS | Status: AC
  Administered 2016-02-23: 2 g via INTRAVENOUS

## 2016-02-23 MED ORDER — SODIUM CHLORIDE 0.9 % IR SOLN
Status: DC | PRN
  Administered 2016-02-23: 1000 mL

## 2016-02-23 MED ORDER — DIPHENHYDRAMINE HCL 25 MG PO CAPS
25.0000 mg | ORAL_CAPSULE | ORAL | Status: DC | PRN
  Filled 2016-02-23: qty 1

## 2016-02-23 MED ORDER — SCOPOLAMINE 1 MG/3DAYS TD PT72: 1.0000 | MEDICATED_PATCH | Freq: Once | TRANSDERMAL | Status: DC

## 2016-02-23 MED ORDER — KETOROLAC TROMETHAMINE 30 MG/ML IJ SOLN: 30.0000 mg | Freq: Four times a day (QID) | INTRAMUSCULAR | Status: AC | PRN

## 2016-02-23 MED ORDER — SODIUM CHLORIDE 0.9% FLUSH: 3.0000 mL | INTRAVENOUS | Status: DC | PRN

## 2016-02-23 MED ORDER — SCOPOLAMINE 1 MG/3DAYS TD PT72
MEDICATED_PATCH | TRANSDERMAL | Status: AC
  Administered 2016-02-23: 1.5 mg via TRANSDERMAL
  Filled 2016-02-23: qty 1

## 2016-02-23 MED ORDER — OXYTOCIN 40 UNITS IN LACTATED RINGERS INFUSION - SIMPLE MED: 2.5000 [IU]/h | INTRAVENOUS | Status: AC

## 2016-02-23 MED ORDER — MORPHINE SULFATE (PF) 0.5 MG/ML IJ SOLN
INTRAMUSCULAR | Status: AC
Start: 2016-02-23 — End: 2016-02-23
  Filled 2016-02-23: qty 10

## 2016-02-23 MED ORDER — DIPHENHYDRAMINE HCL 50 MG/ML IJ SOLN: 12.5000 mg | INTRAMUSCULAR | Status: DC | PRN

## 2016-02-23 MED ORDER — LACTATED RINGERS IV SOLN
INTRAVENOUS | Status: DC
  Administered 2016-02-23: 09:00:00 via INTRAVENOUS

## 2016-02-23 MED ORDER — DIBUCAINE 1 % RE OINT: 1.0000 "application " | TOPICAL_OINTMENT | RECTAL | Status: DC | PRN

## 2016-02-23 MED ORDER — NALBUPHINE HCL 10 MG/ML IJ SOLN: 5.0000 mg | INTRAMUSCULAR | Status: DC | PRN

## 2016-02-23 MED ORDER — SCOPOLAMINE 1 MG/3DAYS TD PT72
1.0000 | MEDICATED_PATCH | Freq: Once | TRANSDERMAL | Status: DC
  Administered 2016-02-23: 1.5 mg via TRANSDERMAL

## 2016-02-23 MED ORDER — DIPHENHYDRAMINE HCL 25 MG PO CAPS: 25.0000 mg | ORAL_CAPSULE | ORAL | Status: DC | PRN

## 2016-02-23 MED ORDER — MENTHOL 3 MG MT LOZG: 1.0000 | LOZENGE | OROMUCOSAL | Status: DC | PRN

## 2016-02-23 MED ORDER — NALOXONE HCL 0.4 MG/ML IJ SOLN: 0.4000 mg | INTRAMUSCULAR | Status: DC | PRN

## 2016-02-23 MED ORDER — COCONUT OIL OIL
1.0000 "application " | TOPICAL_OIL | Status: DC | PRN
  Administered 2016-02-24: 1 via TOPICAL
  Filled 2016-02-23: qty 120

## 2016-02-23 MED ORDER — FENTANYL CITRATE (PF) 100 MCG/2ML IJ SOLN
INTRAMUSCULAR | Status: AC
  Filled 2016-02-23: qty 2

## 2016-02-23 MED ORDER — PRENATAL MULTIVITAMIN CH
1.0000 | ORAL_TABLET | Freq: Every day | ORAL | Status: DC
  Administered 2016-02-24 – 2016-02-25 (×2): 1 via ORAL
  Filled 2016-02-23 (×2): qty 1

## 2016-02-23 MED ORDER — BUPIVACAINE IN DEXTROSE 0.75-8.25 % IT SOLN
INTRATHECAL | Status: DC | PRN
  Administered 2016-02-23: 1.6 mL via INTRATHECAL

## 2016-02-23 MED ORDER — KETOROLAC TROMETHAMINE 30 MG/ML IJ SOLN
INTRAMUSCULAR | Status: AC
  Filled 2016-02-23: qty 1

## 2016-02-23 MED ORDER — CEFAZOLIN SODIUM-DEXTROSE 2-3 GM-% IV SOLR
INTRAVENOUS | Status: AC
  Filled 2016-02-23: qty 50

## 2016-02-23 MED ORDER — SENNOSIDES-DOCUSATE SODIUM 8.6-50 MG PO TABS
2.0000 | ORAL_TABLET | ORAL | Status: DC
  Administered 2016-02-24 (×2): 2 via ORAL
  Filled 2016-02-23 (×2): qty 2

## 2016-02-23 MED ORDER — SIMETHICONE 80 MG PO CHEW
80.0000 mg | CHEWABLE_TABLET | Freq: Three times a day (TID) | ORAL | Status: DC
  Administered 2016-02-24 – 2016-02-25 (×4): 80 mg via ORAL
  Filled 2016-02-23 (×3): qty 1

## 2016-02-23 MED ORDER — SIMETHICONE 80 MG PO CHEW: 80.0000 mg | CHEWABLE_TABLET | ORAL | Status: DC | PRN

## 2016-02-23 MED ORDER — KETOROLAC TROMETHAMINE 30 MG/ML IJ SOLN
30.0000 mg | Freq: Four times a day (QID) | INTRAMUSCULAR | Status: DC | PRN
  Administered 2016-02-23: 30 mg via INTRAMUSCULAR

## 2016-02-23 MED ORDER — ACETAMINOPHEN 325 MG PO TABS
650.0000 mg | ORAL_TABLET | ORAL | Status: DC | PRN
  Administered 2016-02-23: 650 mg via ORAL
  Filled 2016-02-23: qty 2

## 2016-02-23 MED ORDER — LACTATED RINGERS IV SOLN
INTRAVENOUS | Status: DC
  Administered 2016-02-23 – 2016-02-24 (×2): via INTRAVENOUS

## 2016-02-23 MED ORDER — ONDANSETRON HCL 4 MG/2ML IJ SOLN
4.0000 mg | Freq: Three times a day (TID) | INTRAMUSCULAR | Status: DC | PRN
Start: 2016-02-23 — End: 2016-02-23

## 2016-02-23 MED ORDER — PHENYLEPHRINE 8 MG IN D5W 100 ML (0.08MG/ML) PREMIX OPTIME
INJECTION | INTRAVENOUS | Status: AC
  Filled 2016-02-23: qty 100

## 2016-02-23 MED ORDER — OXYTOCIN 10 UNIT/ML IJ SOLN
40.0000 [IU] | INTRAVENOUS | Status: DC | PRN
  Administered 2016-02-23: 40 [IU] via INTRAVENOUS

## 2016-02-23 MED ORDER — MEPERIDINE HCL 25 MG/ML IJ SOLN: 6.2500 mg | INTRAMUSCULAR | Status: DC | PRN

## 2016-02-23 MED ORDER — MEPERIDINE HCL 25 MG/ML IJ SOLN
INTRAMUSCULAR | Status: DC | PRN
  Administered 2016-02-23 (×2): 12.5 mg via INTRAVENOUS

## 2016-02-23 MED ORDER — OXYCODONE-ACETAMINOPHEN 5-325 MG PO TABS
1.0000 | ORAL_TABLET | ORAL | Status: DC | PRN
  Administered 2016-02-24 – 2016-02-25 (×2): 1 via ORAL
  Filled 2016-02-23 (×2): qty 1

## 2016-02-23 MED ORDER — OXYTOCIN 10 UNIT/ML IJ SOLN
INTRAMUSCULAR | Status: AC
  Filled 2016-02-23: qty 4

## 2016-02-23 MED ORDER — ONDANSETRON HCL 4 MG/2ML IJ SOLN
INTRAMUSCULAR | Status: DC | PRN
  Administered 2016-02-23: 4 mg via INTRAVENOUS

## 2016-02-23 MED ORDER — FENTANYL CITRATE (PF) 100 MCG/2ML IJ SOLN
INTRAMUSCULAR | Status: DC | PRN
  Administered 2016-02-23: 10 ug via INTRATHECAL

## 2016-02-23 MED ORDER — KETOROLAC TROMETHAMINE 30 MG/ML IJ SOLN
30.0000 mg | Freq: Four times a day (QID) | INTRAMUSCULAR | Status: AC | PRN
  Administered 2016-02-23: 30 mg via INTRAVENOUS
  Filled 2016-02-23: qty 1

## 2016-02-23 MED ORDER — OXYCODONE-ACETAMINOPHEN 5-325 MG PO TABS
2.0000 | ORAL_TABLET | ORAL | Status: DC | PRN
  Administered 2016-02-23 – 2016-02-24 (×2): 2 via ORAL
  Filled 2016-02-23 (×2): qty 2

## 2016-02-23 MED ORDER — ONDANSETRON HCL 4 MG/2ML IJ SOLN: 4.0000 mg | Freq: Three times a day (TID) | INTRAMUSCULAR | Status: DC | PRN

## 2016-02-23 MED ORDER — MEPERIDINE HCL 25 MG/ML IJ SOLN
INTRAMUSCULAR | Status: AC
  Filled 2016-02-23: qty 1

## 2016-02-23 MED ORDER — LACTATED RINGERS IV SOLN
Freq: Once | INTRAVENOUS | Status: AC
  Administered 2016-02-23 (×3): via INTRAVENOUS

## 2016-02-23 MED ORDER — DIPHENHYDRAMINE HCL 25 MG PO CAPS: 25.0000 mg | ORAL_CAPSULE | Freq: Four times a day (QID) | ORAL | Status: DC | PRN

## 2016-02-23 MED ORDER — IBUPROFEN 600 MG PO TABS
600.0000 mg | ORAL_TABLET | Freq: Four times a day (QID) | ORAL | Status: DC
  Administered 2016-02-24 – 2016-02-25 (×7): 600 mg via ORAL
  Filled 2016-02-23 (×7): qty 1

## 2016-02-23 SURGICAL SUPPLY — 35 items
BENZOIN TINCTURE PRP APPL 2/3 (GAUZE/BANDAGES/DRESSINGS) ×4 IMPLANT
CLAMP CORD UMBIL (MISCELLANEOUS) IMPLANT
CLOSURE WOUND 1/2 X4 (GAUZE/BANDAGES/DRESSINGS)
CLOTH BEACON ORANGE TIMEOUT ST (SAFETY) ×4 IMPLANT
DRSG OPSITE POSTOP 4X10 (GAUZE/BANDAGES/DRESSINGS) ×4 IMPLANT
DURAPREP 26ML APPLICATOR (WOUND CARE) ×4 IMPLANT
ELECT REM PT RETURN 9FT ADLT (ELECTROSURGICAL) ×4
ELECTRODE REM PT RTRN 9FT ADLT (ELECTROSURGICAL) ×2 IMPLANT
EXTRACTOR VACUUM KIWI (MISCELLANEOUS) IMPLANT
GLOVE BIO SURGEON STRL SZ 6 (GLOVE) ×4 IMPLANT
GLOVE BIOGEL PI IND STRL 6 (GLOVE) ×4 IMPLANT
GLOVE BIOGEL PI IND STRL 7.0 (GLOVE) ×2 IMPLANT
GLOVE BIOGEL PI INDICATOR 6 (GLOVE) ×4
GLOVE BIOGEL PI INDICATOR 7.0 (GLOVE) ×2
GOWN STRL REUS W/TWL LRG LVL3 (GOWN DISPOSABLE) ×8 IMPLANT
KIT ABG SYR 3ML LUER SLIP (SYRINGE) ×4 IMPLANT
LIQUID BAND (GAUZE/BANDAGES/DRESSINGS) ×4 IMPLANT
NEEDLE HYPO 25X5/8 SAFETYGLIDE (NEEDLE) ×4 IMPLANT
NS IRRIG 1000ML POUR BTL (IV SOLUTION) ×4 IMPLANT
PACK C SECTION WH (CUSTOM PROCEDURE TRAY) ×4 IMPLANT
PAD ABD 7.5X8 STRL (GAUZE/BANDAGES/DRESSINGS) ×4 IMPLANT
PAD OB MATERNITY 4.3X12.25 (PERSONAL CARE ITEMS) ×4 IMPLANT
PENCIL SMOKE EVAC W/HOLSTER (ELECTROSURGICAL) ×4 IMPLANT
SPONGE GAUZE 4X4 12PLY STER LF (GAUZE/BANDAGES/DRESSINGS) ×8 IMPLANT
STRIP CLOSURE SKIN 1/2X4 (GAUZE/BANDAGES/DRESSINGS) IMPLANT
SUT CHROMIC 0 CTX 36 (SUTURE) ×12 IMPLANT
SUT MON AB 2-0 CT1 27 (SUTURE) ×4 IMPLANT
SUT PDS AB 0 CT1 27 (SUTURE) IMPLANT
SUT PLAIN 0 NONE (SUTURE) IMPLANT
SUT VIC AB 0 CT1 36 (SUTURE) ×4 IMPLANT
SUT VIC AB 2-0 SH 27 (SUTURE) ×2
SUT VIC AB 2-0 SH 27XBRD (SUTURE) ×2 IMPLANT
SUT VIC AB 4-0 KS 27 (SUTURE) IMPLANT
TOWEL OR 17X24 6PK STRL BLUE (TOWEL DISPOSABLE) ×4 IMPLANT
TRAY FOLEY CATH SILVER 14FR (SET/KITS/TRAYS/PACK) IMPLANT

## 2016-02-23 NOTE — Progress Notes (Signed)
No change to H&P.  Drucella Karbowski, DO 

## 2016-02-23 NOTE — Lactation Note (Signed)
This note was copied from a baby's chart. Lactation Consultation Note  Patient Name: Brittany Ware Today's Date: 02/23/2016 Reason for consult: Initial assessment Baby at 7 hr of life and mom reports bf is going well. Her older child was early and in the NICU so she only pumped for him. Offered DEBP due to baby's size and she declined, she would rather latch or manually express to spoon feed. Discussed baby behavior, feeding frequency, baby belly size, voids, wt loss, breast changes, and nipple care. Demonstrated manual expression, colostrum noted bilaterally, spoon in room. Given lactation handouts. Aware of OP services and support group.     Maternal Data Has patient been taught Hand Expression?: Yes Does the patient have breastfeeding experience prior to this delivery?: Yes  Feeding Feeding Type: Breast Fed Length of feed: 20 min  LATCH Score/Interventions Latch: Grasps breast easily, tongue down, lips flanged, rhythmical sucking.  Audible Swallowing: A few with stimulation Intervention(s): Skin to skin;Hand expression Intervention(s): Skin to skin;Hand expression  Type of Nipple: Everted at rest and after stimulation  Comfort (Breast/Nipple): Soft / non-tender     Hold (Positioning): Full assist, staff holds infant at breast  LATCH Score: 7  Lactation Tools Discussed/Used WIC Program: No   Consult Status Consult Status: Follow-up Date: 02/24/16 Follow-up type: In-patient    Rulon Eisenmengerlizabeth E Nhat Hearne 02/23/2016, 4:56 PM

## 2016-02-23 NOTE — Addendum Note (Signed)
Addendum  created 02/23/16 1308 by Heather RobertsJames Jarryd Gratz, MD   Modules edited: Orders, PRL Based Order Sets

## 2016-02-23 NOTE — Anesthesia Procedure Notes (Signed)
Spinal Patient location during procedure: OR Start time: 02/23/2016 8:31 AM End time: 02/23/2016 8:41 AM Staffing Anesthesiologist: Heather RobertsSINGER, Brittany Ware Performed by: anesthesiologist  Preanesthetic Checklist Completed: patient identified, surgical consent, pre-op evaluation, timeout performed, IV checked, risks and benefits discussed and monitors and equipment checked Spinal Block Patient position: sitting Prep: Betadine Patient monitoring: cardiac monitor, continuous pulse ox and blood pressure Approach: midline Location: L2-3 Injection technique: single-shot Needle Needle type: Quincke  Needle gauge: 25 G Needle length: 9 cm Additional Notes Functioning IV was confirmed and monitors were applied. Sterile prep and drape, including hand hygiene and sterile gloves were used. The patient was positioned and the spine was prepped. The skin was anesthetized with lidocaine.  Free flow of clear CSF was obtained prior to injecting local anesthetic into the CSF.  The spinal needle aspirated freely following injection.  The needle was carefully withdrawn.  The patient tolerated the procedure well. SRNA assisted.

## 2016-02-23 NOTE — Op Note (Addendum)
[name] PROCEDURE DATE: 02/23/2016  PREOPERATIVE DIAGNOSIS: Intrauterine pregnancy at  [redacted]w[redacted]d weeks gestation, previous C/S x 1, desires sterility  POSTOPERATIVE DIAGNOSIS: The same  PROCEDURE:  Repeat Low Transverse Cesarean Section with bilateral salpingectomy  SURGEON:  Dr. Mitchel Honour  INDICATIONS: Brittany Ware is a 35 y.o. G2P0101 at [redacted]w[redacted]d scheduled for cesarean section secondary to previous C/S.  The risks of cesarean section discussed with the patient included but were not limited to: bleeding which may require transfusion or reoperation; infection which may require antibiotics; injury to bowel, bladder, ureters or other surrounding organs; injury to the fetus; need for additional procedures including hysterectomy in the event of a life-threatening hemorrhage; placental abnormalities wth subsequent pregnancies, incisional problems, thromboembolic phenomenon and other postoperative/anesthesia complications. The patient also understands the risk of permanence and regret with BTL. The patient concurred with the proposed plan, giving informed written consent for the procedure.    FINDINGS:  Viable female infant in cephalic presentation, APGARs 8,9:  Weight pending.  Clear amniotic fluid.  Intact placenta, three vessel cord.  Grossly normal uterus, ovaries and fallopian tubes. .   ANESTHESIA:    Spinal ESTIMATED BLOOD LOSS: 600 ml SPECIMENS: Placenta sent to pathology.  Bilateral tubes sent to pathology. COMPLICATIONS: None immediate  PROCEDURE IN DETAIL:  The patient received intravenous antibiotics and had sequential compression devices applied to her lower extremities while in the preoperative area.  She was then taken to the operating room where spinal anesthesia was administered and was found to be adequate. She was then placed in a dorsal supine position with a leftward tilt, and prepped and draped in a sterile manner.  A foley catheter was placed into her bladder and attached to  constant gravity.  After an adequate timeout was performed, a Pfannenstiel skin incision was made with scalpel and carried through to the underlying layer of fascia. The fascia was incised in the midline and this incision was extended bilaterally using the Mayo scissors. Kocher clamps were applied to the superior aspect of the fascial incision and the underlying rectus muscles were dissected off bluntly. A similar process was carried out on the inferior aspect of the facial incision. The rectus muscles were separated in the midline bluntly and the peritoneum was entered bluntly.   Bladder flap was created sharply and developed bluntly.  Bladder blade was placed.  A transverse hysterotomy was made with a scalpel and extended bilaterally bluntly. The bladder blade was then removed. The infant was successfully delivered, and cord was clamped and cut and infant was handed over to awaiting neonatology team. Uterine massage was then administered and the placenta delivered intact with three-vessel cord. The uterus was cleared of clot and debris.  The hysterotomy was closed with 0 chromic.  A second imbricating suture of 0-chromic was used to reinforce the incision and aid in hemostasis.  The right fallopian tube was elevated and clamp was applied to distal mesosalpinx.  The tissue was cut and tied using 2-0 Vicryl.  This was performed with serial clamp, cut and tie to the cornu.  Hemostasis was noted.  The contralateral side was treated in the same manner. Hemostasis was observed.  The uterus was returned to the abdomen.  The peritoneum and rectus muscles were noted to be hemostatic and were reapproximated using 3-0 monocryl.  The fascia was closed with 0-PDS in a running fashion with good restoration of anatomy.  The subcutaneus tissue was copiously irrigated.  The skin was closed with 4-0 Vicryl in a  subcuticular fashion.  Pt tolerated the procedure will.  All counts were correct x2.  Pt went to the recovery room in  stable condition.

## 2016-02-23 NOTE — Anesthesia Postprocedure Evaluation (Signed)
Anesthesia Post Note  Patient: Brittany Ware  Procedure(s) Performed: Procedure(s) (LRB): CESAREAN SECTION (N/A) BILATERAL SALPINGECTOMY (Bilateral)  Patient location during evaluation: PACU Anesthesia Type: Spinal and MAC Level of consciousness: awake and alert Pain management: pain level controlled Vital Signs Assessment: post-procedure vital signs reviewed and stable Respiratory status: spontaneous breathing and respiratory function stable Cardiovascular status: blood pressure returned to baseline and stable Postop Assessment: spinal receding Anesthetic complications: no     Last Vitals:  Filed Vitals:   02/23/16 1116 02/23/16 1117  BP:    Pulse: 93 83  Temp:    Resp: 14 20    Last Pain:  Filed Vitals:   02/23/16 1118  PainSc: 2    Pain Goal: Patients Stated Pain Goal: 4 (02/23/16 0728)               Heather RobertsSINGER,Rahiem Schellinger DANIEL

## 2016-02-23 NOTE — Transfer of Care (Signed)
Immediate Anesthesia Transfer of Care Note  Patient: Brittany Ware  Procedure(s) Performed: Procedure(s): CESAREAN SECTION (N/A) BILATERAL SALPINGECTOMY (Bilateral)  Patient Location: PACU  Anesthesia Type:Spinal  Level of Consciousness: awake, alert , oriented and patient cooperative  Airway & Oxygen Therapy: Patient Spontanous Breathing  Post-op Assessment: Report given to RN and Post -op Vital signs reviewed and stable  Post vital signs: Reviewed and stable  Last Vitals:  Filed Vitals:   02/23/16 0728 02/23/16 0743  BP:  139/93  Pulse: 111   Temp: 36.6 C   Resp: 20     Last Pain: There were no vitals filed for this visit.    Patients Stated Pain Goal: 4 (02/23/16 16100728)  Complications: No apparent anesthesia complications

## 2016-02-24 LAB — CBC
HEMATOCRIT: 27.4 % — AB (ref 36.0–46.0)
HEMOGLOBIN: 9.1 g/dL — AB (ref 12.0–15.0)
MCH: 27.2 pg (ref 26.0–34.0)
MCHC: 33.2 g/dL (ref 30.0–36.0)
MCV: 81.8 fL (ref 78.0–100.0)
Platelets: 117 10*3/uL — ABNORMAL LOW (ref 150–400)
RBC: 3.35 MIL/uL — ABNORMAL LOW (ref 3.87–5.11)
RDW: 15.3 % (ref 11.5–15.5)
WBC: 12 10*3/uL — AB (ref 4.0–10.5)

## 2016-02-24 LAB — TYPE AND SCREEN
ABO/RH(D): O POS
Antibody Screen: NEGATIVE
UNIT DIVISION: 0
Unit division: 0

## 2016-02-24 NOTE — Addendum Note (Signed)
Addendum  created 02/24/16 1459 by Algis GreenhouseLinda A Brett Soza, CRNA   Modules edited: Clinical Notes   Clinical Notes:  File: [id number]

## 2016-02-24 NOTE — Lactation Note (Signed)
This note was copied from a baby's chart. Lactation Consultation Note Baby full term under 6 lbs. Educated mom on LPI care even though full term. debp set up and demonstrated. Pumped 15 min. W/no colostrum noted. Hand expression taught, no colostrum noted. W/much stimulation baby is very slow suck and swallow on the slow flow nipple, finally took 15ml 22 cal. Similac. Baby has weak suck, needed a lot of suck training. A lot of education done w/mom. LPI behavior taught.  Patient Name: Brittany Ware ZOXWR'UToday's Date: 02/24/2016 Reason for consult: Follow-up assessment;Infant < 6lbs   Maternal Data    Feeding Feeding Type: Bottle Fed - Formula Nipple Type: Slow - flow  LATCH Score/Interventions Latch: Too sleepy or reluctant, no latch achieved, no sucking elicited. Intervention(s): Skin to skin;Teach feeding cues;Waking techniques  Audible Swallowing: None Intervention(s): Hand expression Intervention(s): Alternate breast massage;Hand expression  Type of Nipple: Everted at rest and after stimulation  Comfort (Breast/Nipple): Soft / non-tender     Hold (Positioning): Assistance needed to correctly position infant at breast and maintain latch. Intervention(s): Breastfeeding basics reviewed;Support Pillows;Position options;Skin to skin  LATCH Score: 7  Lactation Tools Discussed/Used Tools: Pump Breast pump type: Double-Electric Breast Pump Pump Review: Setup, frequency, and cleaning;Milk Storage Initiated by:: Peri JeffersonL. Satonya Lux RN Date initiated:: 02/24/16   Consult Status Consult Status: Follow-up Date: 02/24/16 Follow-up type: In-patient    [name], Diamond NickelLAURA G 02/24/2016, 7:59 AM

## 2016-02-24 NOTE — Progress Notes (Signed)
No complaints.  BP 111/58 mmHg  Pulse 80  Temp(Src) 97.9 F (36.6 C) (Oral)  Resp 18  SpO2 99%  Breastfeeding? Unknown Results for orders placed or performed during the hospital encounter of 02/23/16 (from the past 24 hour(s))  CBC     Status: Abnormal   Collection Time: 02/24/16  6:16 AM  Result Value Ref Range   WBC 12.0 (H) 4.0 - 10.5 K/uL   RBC 3.35 (L) 3.87 - 5.11 MIL/uL   Hemoglobin 9.1 (L) 12.0 - 15.0 g/dL   HCT 40.927.4 (L) 81.136.0 - 91.446.0 %   MCV 81.8 78.0 - 100.0 fL   MCH 27.2 26.0 - 34.0 pg   MCHC 33.2 30.0 - 36.0 g/dL   RDW 78.215.3 95.611.5 - 21.315.5 %   Platelets 117 (L) 150 - 400 K/uL   Abdomen is soft and non tender  IMPRESSION: POD #1 Doing well Routine care Discharge home tomorrow

## 2016-02-24 NOTE — Anesthesia Postprocedure Evaluation (Signed)
Anesthesia Post Note  Patient: [name]  Procedure(s) Performed: Procedure(s) (LRB): CESAREAN SECTION (N/A) BILATERAL SALPINGECTOMY (Bilateral)  Patient location during evaluation: Mother Baby Anesthesia Type: Spinal Level of consciousness: awake Pain management: satisfactory to patient Vital Signs Assessment: post-procedure vital signs reviewed and stable Respiratory status: spontaneous breathing Cardiovascular status: stable Anesthetic complications: no Comments: Current pain is a 6 but she is OK with that and doesn't desire any meds.     Last Vitals:  Filed Vitals:   02/24/16 0525 02/24/16 1000  BP: 111/58 111/57  Pulse: 80 88  Temp: 36.6 C 36.8 C  Resp: 18 16    Last Pain:  Filed Vitals:   02/24/16 1225  PainSc: 2    Pain Goal: Patients Stated Pain Goal: 3 (02/24/16 1000)               Cephus ShellingBURGER,[name]

## 2016-02-25 MED ORDER — OXYCODONE-ACETAMINOPHEN 5-325 MG PO TABS: 1.0000 | ORAL_TABLET | ORAL | Status: AC | PRN

## 2016-02-25 MED ORDER — IBUPROFEN 600 MG PO TABS: 600.0000 mg | ORAL_TABLET | Freq: Four times a day (QID) | ORAL | Status: AC | PRN

## 2016-02-25 NOTE — Discharge Summary (Signed)
Obstetric Discharge Summary Reason for Admission: cesarean section Prenatal Procedures: none Intrapartum Procedures: cesarean: low cervical, transverse Postpartum Procedures: none Complications-Operative and Postpartum: none HEMOGLOBIN  Date Value Ref Range Status  02/24/2016 9.1* 12.0 - 15.0 g/dL Final   HCT  Date Value Ref Range Status  02/24/2016 27.4* 36.0 - 46.0 % Final    Physical Exam:  General: alert Lochia: appropriate Uterine Fundus: firm Incision: healing well DVT Evaluation: No evidence of DVT seen on physical exam.  Discharge Diagnoses: Term Pregnancy-delivered  Discharge Information: Date: 02/25/2016 Activity: pelvic rest Diet: routine Medications: PNV, Ibuprofen, Iron and Percocet Condition: stable Instructions: refer to practice specific booklet Discharge to: home Follow-up Information    Follow up with Meriel PicaHOLLAND,Sadhana Frater M, MD. Schedule an appointment as soon as possible for a visit in 1 week.   Specialty:  Obstetrics and Gynecology   Contact information:   [address] [place] [id number] [phone]       Newborn Data: Live born female  Birth Weight: 5 lb 5.9 oz (2435 g) APGAR: 8, 9  Home with mother.  Brittany Ware M 02/25/2016, 11:15 AM

## 2016-03-05 ENCOUNTER — Encounter (HOSPITAL_COMMUNITY): Payer: Self-pay | Admitting: Emergency Medicine

## 2016-03-05 ENCOUNTER — Emergency Department (HOSPITAL_COMMUNITY)
Admission: EM | Admit: 2016-03-05 | Discharge: 2016-03-05 | Disposition: A | Discharge status: Home / Self Care | Payer: BC Managed Care – PPO | Attending: Emergency Medicine | Admitting: Emergency Medicine

## 2016-03-05 DIAGNOSIS — O9089 Other complications of the puerperium, not elsewhere classified: Secondary | ICD-10-CM | POA: Insufficient documentation

## 2016-03-05 DIAGNOSIS — O1093 Unspecified pre-existing hypertension complicating the puerperium: Secondary | ICD-10-CM | POA: Diagnosis not present

## 2016-03-05 LAB — CBC WITH DIFFERENTIAL/PLATELET
Basophils Absolute: 0.1 10*3/uL (ref 0.0–0.1)
Basophils Relative: 0 %
EOS PCT: 2 %
Eosinophils Absolute: 0.3 10*3/uL (ref 0.0–0.7)
HCT: 32.2 % — ABNORMAL LOW (ref 36.0–46.0)
Hemoglobin: 10.1 g/dL — ABNORMAL LOW (ref 12.0–15.0)
LYMPHS ABS: 2.5 10*3/uL (ref 0.7–4.0)
LYMPHS PCT: 20 %
MCH: 25.9 pg — AB (ref 26.0–34.0)
MCHC: 31.4 g/dL (ref 30.0–36.0)
MCV: 82.6 fL (ref 78.0–100.0)
MONO ABS: 0.6 10*3/uL (ref 0.1–1.0)
Monocytes Relative: 5 %
Neutro Abs: 9.1 10*3/uL — ABNORMAL HIGH (ref 1.7–7.7)
Neutrophils Relative %: 73 %
PLATELETS: 255 10*3/uL (ref 150–400)
RBC: 3.9 MIL/uL (ref 3.87–5.11)
RDW: 15 % (ref 11.5–15.5)
WBC: 12.6 10*3/uL — ABNORMAL HIGH (ref 4.0–10.5)

## 2016-03-05 NOTE — ED Notes (Signed)
Pt's name called for room no answer 

## 2016-03-05 NOTE — ED Notes (Signed)
Pt had c section on 5/15, today noticed significant amounts of serosanguinous fluid from incision. Pt denies fever, chills, pain. Pt reports "probably doing too much activity." Pt reports changing her pants 3 times since 1430, pt with wet stain to front of pants in triage.

## 2016-03-05 NOTE — ED Notes (Signed)
Pts name called again for room - no answer.

## 2016-03-11 NOTE — ED Provider Notes (Signed)
[facility] after triage.   Marlon Peliffany Ollen Rao, PA-C 03/11/16 2252  Leta BaptistEmily Roe Nguyen, MD 03/13/16 [phone]

## 2017-05-03 IMAGING — US US SOFT TISSUE HEAD/NECK
1 series · 14 of 25 positions shown · non-contrast
Comparison: None.

CLINICAL DATA: 34-year-old female with thyromegaly

EXAM:
THYROID ULTRASOUND
TECHNIQUE: Ultrasound examination of the thyroid gland and adjacent soft
tissues was performed.

[Series 1: us soft tissue head/neck · 0.06mm/px · 14 of 31 slices shown]
[im 1/31]
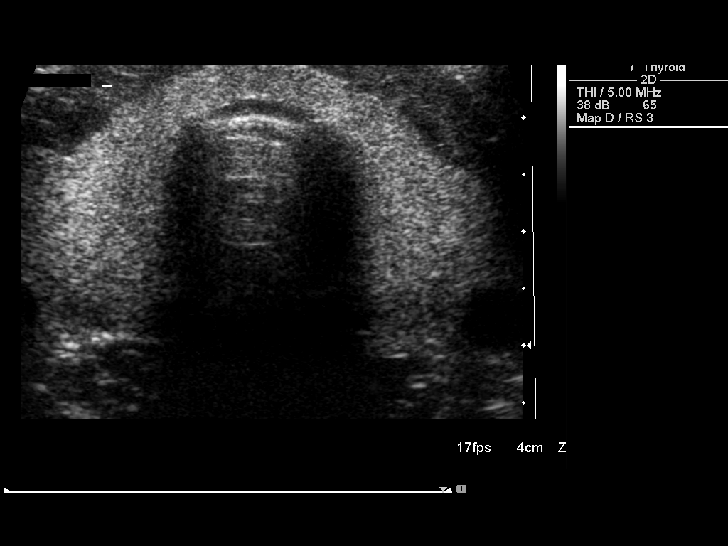
[im 3/31]
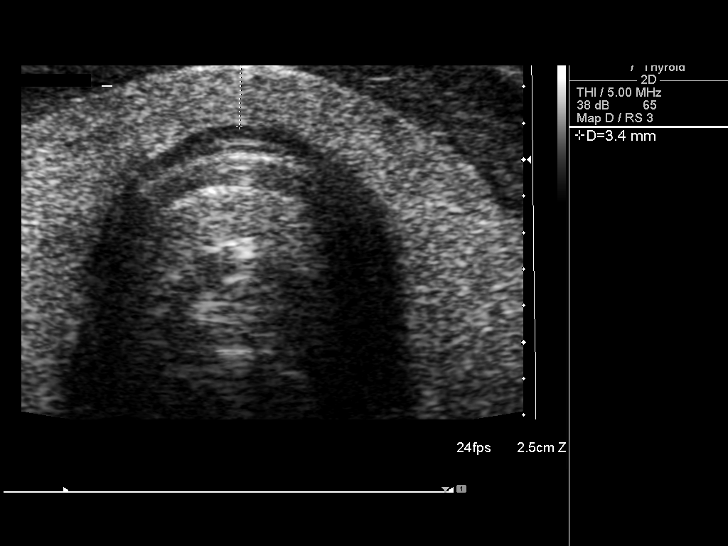
[im 6/31]
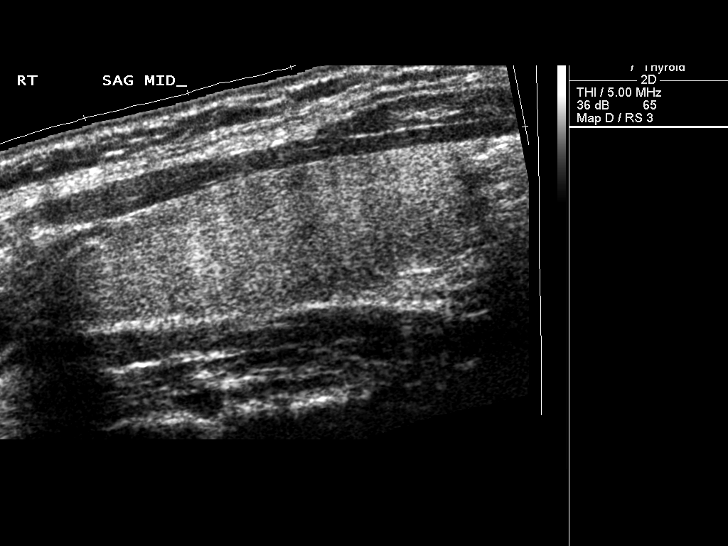
[im 8/31]
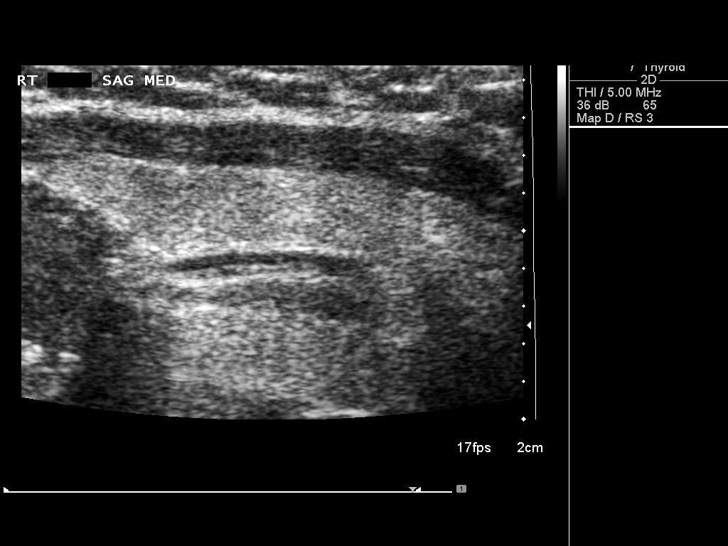
[im 11/31]
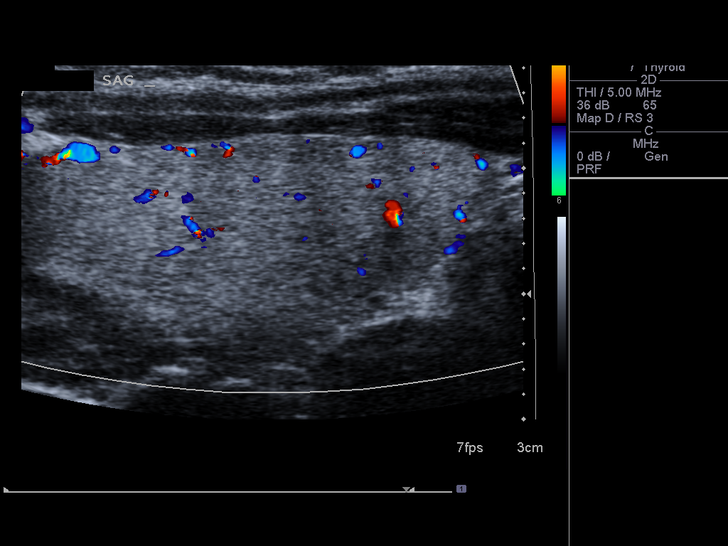
[im 12/31]
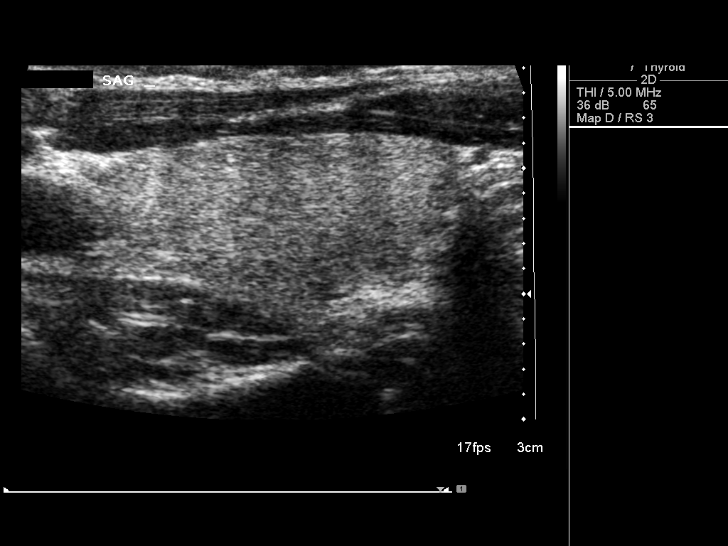
[im 14/31]
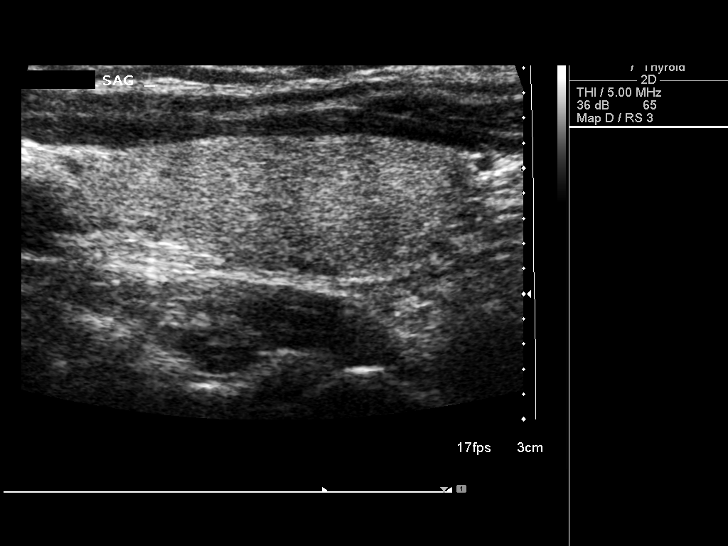
[im 17/31]
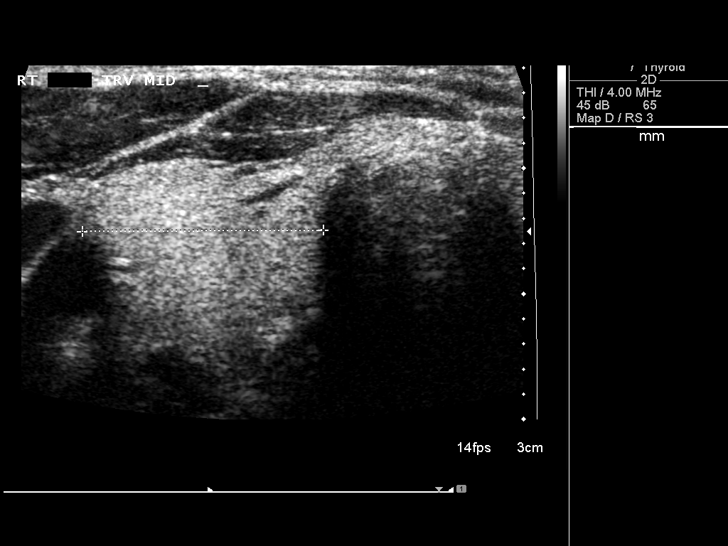
[im 19/31]
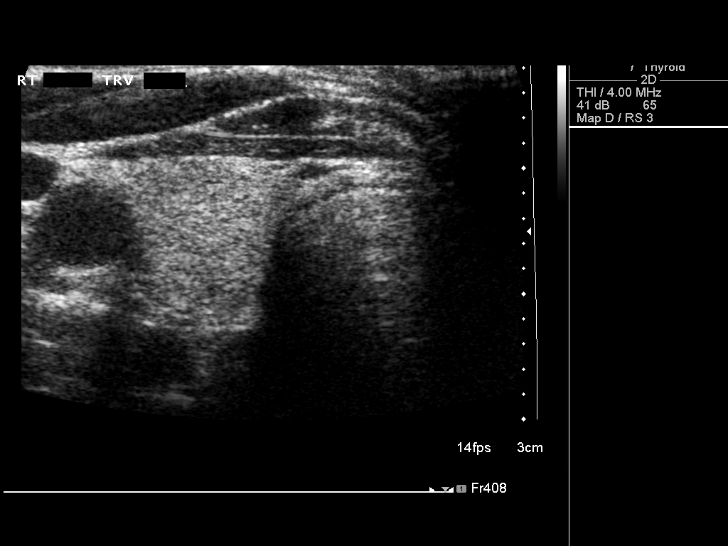
[im 21/31]
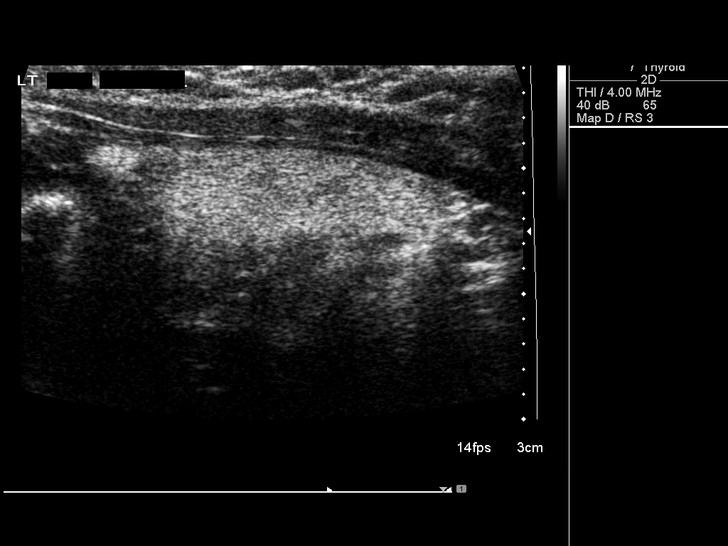
[im 23/31]
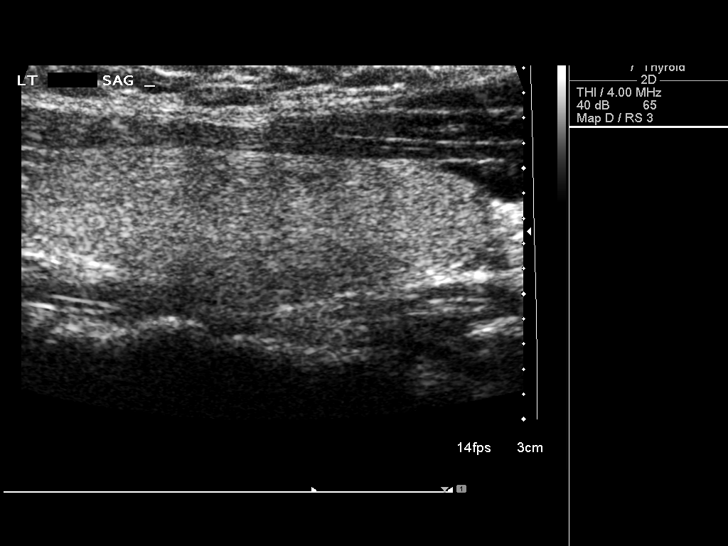
[im 26/31]
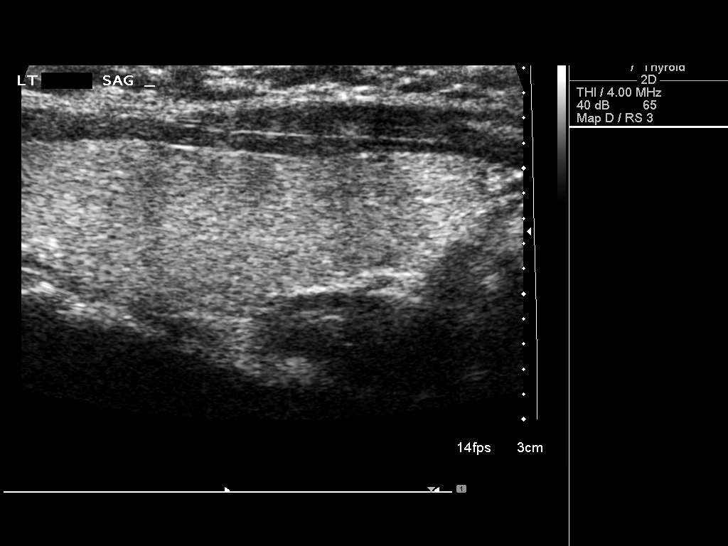
[im 28/31]
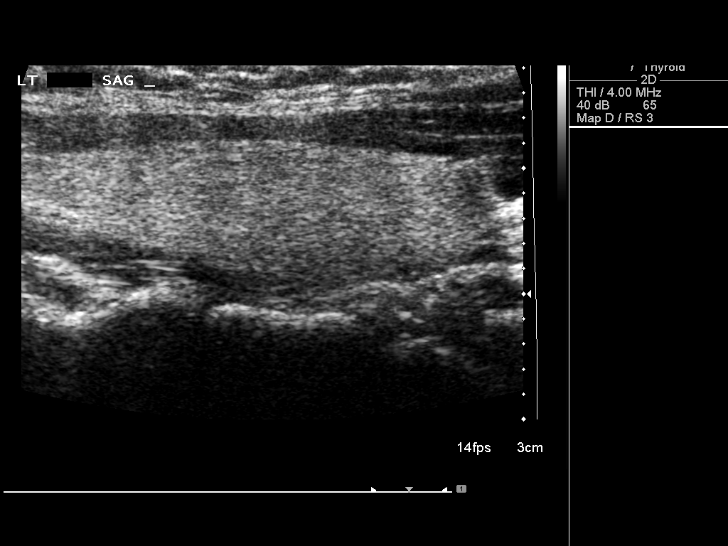
[im 31/31]
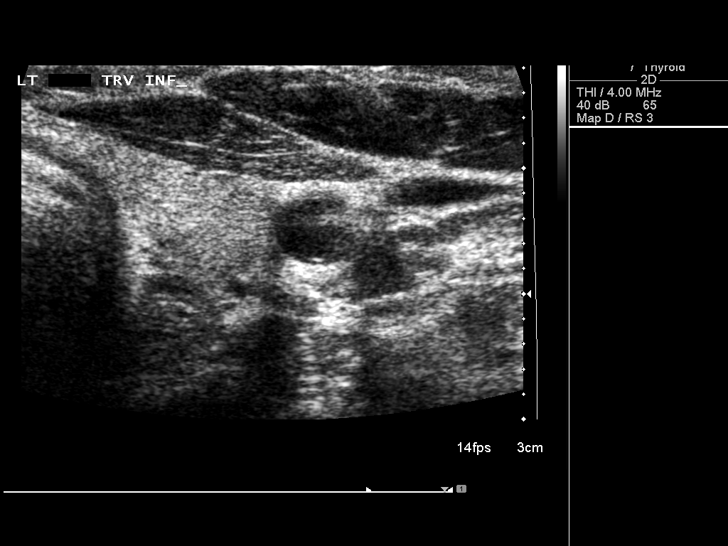

[14 of 25 positions shown; findings below may reference images not displayed]

FINDINGS: Right thyroid lobe

Measurements: 4.3 x 1.4 x 1.9 cm.  No nodules visualized.

Left thyroid lobe

Measurements: 3.8 x 1.6 x 1.9 cm.  No nodules visualized.

Isthmus

Thickness: 0.3 cm.  No nodules visualized.

Lymphadenopathy

None visualized.
IMPRESSION: Normal sonographic appearance of the thyroid gland.

## 2019-07-29 ENCOUNTER — Other Ambulatory Visit: Payer: Self-pay

## 2019-07-29 ENCOUNTER — Encounter (HOSPITAL_COMMUNITY): Payer: Self-pay

## 2019-07-29 ENCOUNTER — Ambulatory Visit (HOSPITAL_COMMUNITY)
Admission: EM | Admit: 2019-07-29 | Discharge: 2019-07-29 | Disposition: A | Discharge status: Home / Self Care | Payer: BC Managed Care – PPO | Attending: Emergency Medicine | Admitting: Emergency Medicine

## 2019-07-29 DIAGNOSIS — R42 Dizziness and giddiness: Secondary | ICD-10-CM | POA: Insufficient documentation

## 2019-07-29 DIAGNOSIS — I10 Essential (primary) hypertension: Secondary | ICD-10-CM | POA: Diagnosis not present

## 2019-07-29 DIAGNOSIS — R112 Nausea with vomiting, unspecified: Secondary | ICD-10-CM | POA: Diagnosis not present

## 2019-07-29 DIAGNOSIS — R519 Headache, unspecified: Secondary | ICD-10-CM | POA: Diagnosis present

## 2019-07-29 DIAGNOSIS — Z20828 Contact with and (suspected) exposure to other viral communicable diseases: Secondary | ICD-10-CM | POA: Diagnosis not present

## 2019-07-29 DIAGNOSIS — G43809 Other migraine, not intractable, without status migrainosus: Secondary | ICD-10-CM | POA: Diagnosis not present

## 2019-07-29 LAB — POCT URINALYSIS DIP (DEVICE)
Glucose, UA: NEGATIVE mg/dL
Hgb urine dipstick: NEGATIVE
Ketones, ur: 40 mg/dL — AB
Leukocytes,Ua: NEGATIVE
Nitrite: NEGATIVE
Protein, ur: 100 mg/dL — AB
Specific Gravity, Urine: >=1.03 (ref 1.005–1.030)
Urobilinogen, UA: 1 mg/dL (ref 0.0–1.0)
pH: 6 (ref 5.0–8.0)

## 2019-07-29 MED ORDER — DEXAMETHASONE SODIUM PHOSPHATE 10 MG/ML IJ SOLN
INTRAMUSCULAR | Status: AC
  Filled 2019-07-29: qty 1

## 2019-07-29 MED ORDER — ONDANSETRON 4 MG PO TBDP: 4.0000 mg | ORAL_TABLET | Freq: Three times a day (TID) | ORAL | 0 refills | Status: AC | PRN

## 2019-07-29 MED ORDER — ONDANSETRON 4 MG PO TBDP
ORAL_TABLET | ORAL | Status: AC
  Filled 2019-07-29: qty 1

## 2019-07-29 MED ORDER — ONDANSETRON 4 MG PO TBDP
4.0000 mg | ORAL_TABLET | Freq: Once | ORAL | Status: AC
  Administered 2019-07-29: 18:00:00 4 mg via ORAL

## 2019-07-29 MED ORDER — KETOROLAC TROMETHAMINE 60 MG/2ML IM SOLN
INTRAMUSCULAR | Status: AC
  Filled 2019-07-29: qty 2

## 2019-07-29 MED ORDER — DEXAMETHASONE SODIUM PHOSPHATE 10 MG/ML IJ SOLN
10.0000 mg | Freq: Once | INTRAMUSCULAR | Status: AC
  Administered 2019-07-29: 10 mg via INTRAMUSCULAR

## 2019-07-29 MED ORDER — KETOROLAC TROMETHAMINE 60 MG/2ML IM SOLN
60.0000 mg | Freq: Once | INTRAMUSCULAR | Status: AC
  Administered 2019-07-29: 18:00:00 60 mg via INTRAMUSCULAR

## 2019-07-29 NOTE — ED Triage Notes (Signed)
Pt present fever, nausea and vomiting with dizziness. Symptoms started yesterday. Pt states she has not had an appetite

## 2019-07-29 NOTE — ED Provider Notes (Signed)
[facility]    CSN: [id number] Arrival date & time: 07/29/19  1714      History   Chief Complaint Chief Complaint  Patient presents with  . Fever  . Dizziness  . Nausea    HPI Brittany Ware is a 38 y.o. female.   Brittany Ware presents with complaints of headache which started yesterday as well as headache. Burning to nose and frontal head. Took over the counter  Sinus medciations which didn't help. Temp of 99.8 last night. Woke this morning and felt somewhat improved, then started getting dizzy and vomited. Now with chills. Still with headache. Dizziness worse with sitting. No ear pain. No sore throat. No cough. Currently nausea. Feels like the nausea related to headache. Hasn't eaten today. Vomited twice today. Non bloody. No significant abdomninal pain. No diarrha. No known ill contacts. Denies any previous similar. Headache is 9/10. No vision. Has had headache similar to this in the past, similar to previous migraines. Last was oct 3rd. Typically aleve helps. Aleve didn't help today. Still urinating normally. No light or sound sensitivity. History  Of tachycardia.    ROS per HPI, negative if not otherwise mentioned.      Past Medical History:  Diagnosis Date  . Genital HSV   . Genital warts   . Hypertension     Patient Active Problem List   Diagnosis Date Noted  . S/P cesarean section 02/23/2016  . Streptococcal sore throat 01/28/2015  . Tachycardia 06/29/2013  . De Quervain's tenosynovitis, right 04/25/2013  . IUGR (intrauterine growth restriction) 12/30/2012    Past Surgical History:  Procedure Laterality Date  . BILATERAL SALPINGECTOMY Bilateral 02/23/2016   Procedure: BILATERAL SALPINGECTOMY;  Surgeon: Mitchel Honour, DO;  Location: [facility];  Service: Obstetrics;  Laterality: Bilateral;  . CESAREAN SECTION N/A 01/19/2013   Procedure: CESAREAN SECTION;  Surgeon: Tresa Endo A. Ernestina Penna, MD;  Location: [facility];  Service: Obstetrics;  Laterality:  N/A;  primary   . CESAREAN SECTION N/A 02/23/2016   Procedure: CESAREAN SECTION;  Surgeon: Mitchel Honour, DO;  Location: [facility];  Service: Obstetrics;  Laterality: N/A;    OB History    Gravida  2   Para  2   Term  1   Preterm  1   AB      Living  2     SAB      TAB      Ectopic      Multiple  0   Live Births  2            Home Medications    Prior to Admission medications   Medication Sig Start Date End Date Taking? Authorizing Provider  ibuprofen (ADVIL,MOTRIN) 600 MG tablet Take 1 tablet (600 mg total) by mouth every 6 (six) hours as needed for moderate pain. 02/25/16   Richarda Overlie, MD  ondansetron (ZOFRAN-ODT) 4 MG disintegrating tablet Take 1 tablet (4 mg total) by mouth every 8 (eight) hours as needed for nausea or vomiting. 07/29/19   Georgetta Haber, NP  oxyCODONE-acetaminophen (PERCOCET/ROXICET) 5-325 MG tablet Take 1 tablet by mouth every 4 (four) hours as needed (pain scale 4-7). 02/25/16   Richarda Overlie, MD  Prenatal Vit-Fe Fumarate-FA (MULTIVITAMIN-PRENATAL) 27-0.8 MG TABS tablet Take 1 tablet by mouth daily at 12 noon.    [provider]    Family History Family History  Problem Relation Age of Onset  . Alcohol abuse Father   . Prostate cancer Maternal Grandfather   .  Thyroid disease Mother     Social History Social History   Tobacco Use  . Smoking status: Never Smoker  Substance Use Topics  . Alcohol use: No    Comment: 2-3 x a month  . Drug use: No     Allergies   Patient has no known allergies.   Review of Systems Review of Systems   Physical Exam Triage Vital Signs ED Triage Vitals  Enc Vitals Group     BP 07/29/19 1729 138/84     Pulse Rate 07/29/19 1729 (!) 114     Resp 07/29/19 1729 16     Temp 07/29/19 1729 97.8 F (36.6 C)     Temp Source 07/29/19 1729 Skin     SpO2 07/29/19 1729 100 %     Weight --      Height --      Head Circumference --      Peak Flow --      Pain Score  07/29/19 1727 8     Pain Loc --      Pain Edu? --      Excl. in GC? --    No data found.  Updated Vital Signs BP 138/84 (BP Location: Left Arm)   Pulse (!) 114   Temp 97.8 F (36.6 C) (Skin)   Resp 16   LMP 07/14/2019   SpO2 100%   Physical Exam Constitutional:      General: She is not in acute distress.    Appearance: She is well-developed.  HENT:     Head: Normocephalic and atraumatic.  Eyes:     Extraocular Movements: Extraocular movements intact.     Pupils: Pupils are equal, round, and reactive to light.  Cardiovascular:     Rate and Rhythm: Tachycardia present.  Pulmonary:     Effort: Pulmonary effort is normal.  Abdominal:     Tenderness: There is no abdominal tenderness. There is no right CVA tenderness or left CVA tenderness.  Skin:    General: Skin is warm and dry.  Neurological:     General: No focal deficit present.     Mental Status: She is alert and oriented to person, place, and time.     Cranial Nerves: No cranial nerve deficit.     Sensory: Sensation is intact. No sensory deficit.     Motor: Motor function is intact. No weakness.     Coordination: Coordination is intact. Coordination normal.     Gait: Gait is intact.  Psychiatric:        Mood and Affect: Mood normal.      UC Treatments / Results  Labs (all labs ordered are listed, but only abnormal results are displayed) Labs Reviewed  POCT URINALYSIS DIP (DEVICE) - Abnormal; Notable for the following components:      Result Value   Bilirubin Urine SMALL (*)    Ketones, ur 40 (*)    Protein, ur 100 (*)    All other components within normal limits  NOVEL CORONAVIRUS, NAA (HOSP ORDER, SEND-OUT TO REF LAB; TAT 18-24 HRS)    EKG   Radiology No results found.  Procedures Procedures (including critical care time)  Medications Ordered in UC Medications  ondansetron (ZOFRAN-ODT) disintegrating tablet 4 mg (4 mg Oral Given 07/29/19 1802)  ketorolac (TORADOL) injection 60 mg (60 mg  Intramuscular Given 07/29/19 1808)  dexamethasone (DECADRON) injection 10 mg (10 mg Intramuscular Given 07/29/19 1809)  ondansetron (ZOFRAN-ODT) 4 MG disintegrating tablet (has no administration in time range)  dexamethasone (DECADRON) 10 MG/ML injection (has no administration in time range)  ketorolac (TORADOL) 60 MG/2ML injection (has no administration in time range)    Initial Impression / Assessment and Plan / UC Course  I have reviewed the triage vital signs and the nursing notes.  Pertinent labs & imaging results that were available during my care of the patient were reviewed by me and considered in my medical decision making (see chart for details).     Patient tolerating liquids here in clinic, nausea improved with zofran. No red flag neurological findings on exam. Headache is similar to previous but last longer than usual for her. Tachycardia as well as ketones to urine do support dehydration, again, tolerating liquids and urinating without difficulty. Encouraged to push fluids. After toradol and decadron here in clinic patient with improvement in headache and dizziness, ready for d/c. Return precautions provided. covid testing pending as well. Counseling provided. Patient verbalized understanding and agreeable to plan.  Ambulatory out of clinic without difficulty.    Final Clinical Impressions(s) / UC Diagnoses   Final diagnoses:  Other migraine without status migrainosus, not intractable  Dizziness     Discharge Instructions     Zofran every 8 hours as needed for nausea or vomiting.   Small frequent sips of fluids- Pedialyte, Gatorade, water, broth- to maintain hydration, as you do appear dehydrated here today.  Rest in a quiet dark room, limit screen time.  May repeat tylenol tonight if needed but don't take any additional ibuprofen or aleve until tomorrow.  Self isolate until covid results are back and negative.  Will notify you of any positive findings. You may monitor  your results on your MyChart online as well.       ED Prescriptions    Medication Sig Dispense Auth. Provider   ondansetron (ZOFRAN-ODT) 4 MG disintegrating tablet Take 1 tablet (4 mg total) by mouth every 8 (eight) hours as needed for nausea or vomiting. 12 tablet Zigmund Gottron, NP     PDMP not reviewed this encounter.   Zigmund Gottron, NP 07/29/19 1839

## 2019-07-29 NOTE — Discharge Instructions (Signed)
Zofran every 8 hours as needed for nausea or vomiting.   Small frequent sips of fluids- Pedialyte, Gatorade, water, broth- to maintain hydration, as you do appear dehydrated here today.  Rest in a quiet dark room, limit screen time.  May repeat tylenol tonight if needed but don't take any additional ibuprofen or aleve until tomorrow.  Self isolate until covid results are back and negative.  Will notify you of any positive findings. You may monitor your results on your MyChart online as well.

## 2019-07-30 LAB — NOVEL CORONAVIRUS, NAA (HOSP ORDER, SEND-OUT TO REF LAB; TAT 18-24 HRS): SARS-CoV-2, NAA: NOT DETECTED
